# Patient Record
Sex: Male | Born: 1980 | Race: White | Hispanic: No | Marital: Single
Health system: Midwestern US, Academic
[De-identification: ages and names within clinical notes are randomized; demographics above are authoritative.]

---

## 2017-05-15 MED ORDER — INSULIN 100 UNITS IN 100 ML NS INJECTION
5 [IU] | Freq: Once | INTRAVENOUS | 0 refills | Status: DC
Start: 2017-05-15 — End: 2017-05-15

## 2017-05-15 MED ORDER — DOXYCYCLINE HYCLATE 100 MG PO TAB
100 mg | ORAL_TABLET | Freq: Two times a day (BID) | ORAL | 0 refills | 8.00000 days | Status: DC
Start: 2017-05-15 — End: 2017-08-02

## 2017-05-15 MED ORDER — LACTATED RINGERS IV SOLP
1000 mL | INTRAVENOUS | 0 refills | Status: CP
Start: 2017-05-15 — End: ?

## 2017-05-15 MED ORDER — ACETAMINOPHEN 500 MG PO TAB
1000 mg | Freq: Once | ORAL | 0 refills | Status: CP
Start: 2017-05-15 — End: ?

## 2017-05-15 MED ORDER — INSULIN ASPART 100 UNIT/ML SC FLEXPEN
5 [IU] | Freq: Once | SUBCUTANEOUS | 0 refills | Status: CP
Start: 2017-05-15 — End: ?

## 2017-05-15 MED ORDER — DOXYCYCLINE MONOHYDRATE 100 MG PO CAP
100 mg | ORAL_CAPSULE | Freq: Two times a day (BID) | ORAL | 0 refills | 8.00000 days | Status: DC
Start: 2017-05-15 — End: 2017-08-02

## 2017-05-15 MED ORDER — LACTATED RINGERS IV SOLP
1000 mL | INTRAVENOUS | 0 refills | Status: DC
Start: 2017-05-15 — End: 2017-05-15

## 2017-05-22 ENCOUNTER — Encounter: Admit: 2017-05-22 | Discharge: 2017-05-22 | Payer: MEDICAID

## 2017-05-22 ENCOUNTER — Emergency Department: Admit: 2017-05-22 | Discharge: 2017-05-23 | Disposition: A | Discharge status: Home / Self Care

## 2017-05-22 LAB — URINALYSIS, MICROSCOPIC

## 2017-05-22 LAB — POC GLUCOSE
Lab: 482 mg/dL — ABNORMAL HIGH (ref 70–100)
Lab: 502 mg/dL — ABNORMAL HIGH (ref 70–100)
Lab: 252 mg/dL — ABNORMAL HIGH (ref 70–100)

## 2017-05-22 LAB — POC LACTATE: Lab: 1.3 MMOL/L (ref 0.5–2.0)

## 2017-05-22 LAB — CBC AND DIFF
Lab: 0 10*3/uL (ref 0–0.20)
Lab: 0.1 10*3/uL (ref 0–0.45)
Lab: 7.3 10*3/uL (ref 4.5–11.0)

## 2017-05-22 LAB — URINALYSIS DIPSTICK
Lab: 7 % — ABNORMAL HIGH (ref 5.0–8.0)
Lab: NEGATIVE K/UL (ref 3–12)
Lab: NEGATIVE U/L (ref 7–40)
Lab: NEGATIVE U/L — ABNORMAL HIGH (ref 25–110)
Lab: NEGATIVE mL/min (ref 1.0–4.8)

## 2017-05-22 LAB — BLOOD GASES, PERIPHERAL VENOUS: Lab: 7.4 MMOL/L — ABNORMAL HIGH (ref 7.30–7.40)

## 2017-05-22 LAB — BETA HYDROXYBUTYRATE (KETONES): Lab: 0.2 MMOL/L — ABNORMAL LOW (ref <0.3)

## 2017-05-22 LAB — COMPREHENSIVE METABOLIC PANEL
Lab: 133 MMOL/L — ABNORMAL LOW (ref 137–147)
Lab: >60 mL/min (ref >60)

## 2017-05-22 MED ORDER — INSULIN REGULAR HUMAN(#) 1 UNIT/ML IJ SYRINGE
10 [IU] | Freq: Once | INTRAVENOUS | 0 refills | Status: CP
Start: 2017-05-22 — End: ?
  Administered 2017-05-22: 19:00:00 10 [IU] via INTRAVENOUS

## 2017-05-22 MED ORDER — DOXYCYCLINE HYCLATE 100 MG PO TAB
100 mg | ORAL_TABLET | Freq: Two times a day (BID) | ORAL | 0 refills | 8.00000 days | Status: AC
Start: 2017-05-22 — End: 2017-08-02

## 2017-05-22 MED ORDER — FENTANYL CITRATE (PF) 50 MCG/ML IJ SOLN
50 ug | Freq: Once | INTRAVENOUS | 0 refills | Status: CP
Start: 2017-05-22 — End: ?
  Administered 2017-05-22: 19:00:00 50 ug via INTRAVENOUS

## 2017-05-22 MED ORDER — ACETAMINOPHEN 325 MG PO TAB
650 mg | Freq: Once | ORAL | 0 refills | Status: CP
Start: 2017-05-22 — End: ?
  Administered 2017-05-23: 01:00:00 650 mg via ORAL

## 2017-05-22 MED ORDER — LACTATED RINGERS IV SOLP
1000 mL | INTRAVENOUS | 0 refills | Status: CP
Start: 2017-05-22 — End: ?
  Administered 2017-05-22: 17:00:00 1000 mL via INTRAVENOUS

## 2017-05-22 NOTE — Other
Critical result or procedure called (document test and value, and read back):  POC glucose 502  Time MD/NP Notified:  1154  MD/NP Name:  Dr. Hyacinth MeekerMiller  MD/NP Response/Orders Given:  Acknowledged

## 2017-05-22 NOTE — Other
Critical result or procedure called (document test and value, and read back):  Blood glucose 525, read back complete   Time MD/NP Notified:  1256  MD/NP Name:  Dr. Manson PasseyBrown  MD/NP Response/Orders Given:  Notified, orders in place

## 2017-05-22 NOTE — Other
Critical result or procedure called (document test and value, and read back):  Blood glucose 525  Time MD/NP Notified: 1301  MD/NP Name:  Dr. Hyacinth MeekerMiller  MD/NP Response/Orders Given:  Acknowledged

## 2017-05-22 NOTE — ED Notes
Pt requesting pain medication for 10/10 abdominal pain. Will notify MD.

## 2017-05-22 NOTE — ED Notes
[name] arrived to ED with CC of abdominal pain and scrotum pain. Pt states he had an abscess on his abdomen drained in April and it began bleeding/draining a few days ago. Pt also states he is a diabetic and hasn't had his insulin since last Saturday due to traveling and not having enough of his medication. Pt reports N/V and chills over the past few days. Pt currently afebrile. Pt abdomen tender on palpation. Pt denies trauma to scrotal area and states he noticed it began hurting on Tuesday. Pt denies taking anything at home to help with the pain because he doesn't have any medication with him. Pt aaox4; breathing non-labored; skin warm/dry/appropriate for ethnicity.     Pt belongings:  DealerBlack fingerless gloves  Black wheelchair  Baseball cap  Red t shirt  Cell phone  shorts

## 2017-05-23 ENCOUNTER — Emergency Department: Admit: 2017-05-22 | Discharge: 2017-05-22 | Payer: MEDICAID

## 2017-05-23 DIAGNOSIS — R739 Hyperglycemia, unspecified: ICD-10-CM

## 2017-05-23 DIAGNOSIS — L0291 Cutaneous abscess, unspecified: Principal | ICD-10-CM

## 2017-05-23 NOTE — Case Management (ED)
Case Management Progress Note    NAME:Bryan Bell                          MRN: [id number]              DOB:10/30/1981          AGE: 36 y.o.  ADMISSION DATE: 05/22/2017             DAYS ADMITTED: LOS: 0 days      Today???s Date: 05/22/2017    Plan: Pt provided with a bus pass.     Interventions  ? Support      ? Info or Referral      ? Discharge Planning       ??? SW was notified by RN that pt was in need of a medication voucher as he was unable to afford the prescription provided to him by [facility] ED last weekend.   ??? Pt's previous visit listed under MRN [id number].  ??? SW met with pt last weekend and he is not eligible for a medication voucher as he has been provided one within the last year.   ??? Per medication voucher policy, only one voucher can be provided per year and pt received multiple vouchers in 2017.   ??? SW notified pt last weekend that he could set up an account with [facility] and receive his medications, which he did not do.   ??? SW met with pt and notified him again that SW unable to provide medication voucher related to policy and that he can set up an account with the pharmacy to obtain his antibiotic today.   ??? Pt stated he would not be able to pay the 10% due for the prescription next month.   ??? SW provided pt with [facility] [place] information as they may be able to help.   ??? Pt and SW discussed discharge disposition.   ??? Pt's friend in [place] cannot come get him as her car is not working.   ??? SW is not able to cab pt to [place] as cabs do not go that far.   ??? Pt does not have anyone local that he wants to stay with and said that he can't stay with family.  ??? Pt said he would rather sleep in a ditch than go to the shelter.   ??? Pt asked for a change of clothing, which was provided.   ??? SW encouraged pt to notify SW if he thinks of someone to stay with locally as SW can assist with transportation.   ??? SW was notified by RN that pt was provided a bus pass at discharge.     ? Medication Needs ? Financial      ? Legal      ? Other        Disposition  ? Expected Discharge Date       ? Transportation      ? Next Level of Care (Acute Psych discharges only)      ? Discharge Disposition    Durable Medical Equipment     No service has been selected for the patient.      [place] [facility]     No service has been selected for the patient.      [facility]     No service has been selected for the patient.       Dialysis/Infusion     No service has been  selected for the patient.          Wilfrid Lund, LMSW  Social Work Sports coach  Emergency Department  Pager [phone]

## 2017-05-27 ENCOUNTER — Encounter: Admit: 2017-05-27 | Discharge: 2017-05-27 | Payer: MEDICAID

## 2017-05-27 DIAGNOSIS — F99 Mental disorder, not otherwise specified: ICD-10-CM

## 2017-05-27 DIAGNOSIS — J449 Chronic obstructive pulmonary disease, unspecified: ICD-10-CM

## 2017-05-27 DIAGNOSIS — E785 Hyperlipidemia, unspecified: ICD-10-CM

## 2017-05-27 DIAGNOSIS — I10 Essential (primary) hypertension: ICD-10-CM

## 2017-05-27 DIAGNOSIS — E119 Type 2 diabetes mellitus without complications: Principal | ICD-10-CM

## 2017-08-01 ENCOUNTER — Inpatient Hospital Stay: Admit: 2017-08-01 | Discharge: 2017-08-06 | Disposition: A | Discharge status: Home / Self Care

## 2017-08-01 ENCOUNTER — Encounter: Admit: 2017-08-01 | Discharge: 2017-08-01 | Payer: MEDICAID

## 2017-08-01 DIAGNOSIS — E785 Hyperlipidemia, unspecified: ICD-10-CM

## 2017-08-01 DIAGNOSIS — S21209A Unspecified open wound of unspecified back wall of thorax without penetration into thoracic cavity, initial encounter: ICD-10-CM

## 2017-08-01 DIAGNOSIS — I10 Essential (primary) hypertension: ICD-10-CM

## 2017-08-01 DIAGNOSIS — F99 Mental disorder, not otherwise specified: ICD-10-CM

## 2017-08-01 DIAGNOSIS — E119 Type 2 diabetes mellitus without complications: Principal | ICD-10-CM

## 2017-08-01 DIAGNOSIS — J449 Chronic obstructive pulmonary disease, unspecified: ICD-10-CM

## 2017-08-01 LAB — COMPREHENSIVE METABOLIC PANEL
Lab: 0.3 mg/dL (ref 0.3–1.2)
Lab: 0.6 mg/dL (ref 0.4–1.24)
Lab: 102 MMOL/L — ABNORMAL LOW (ref 98–110)
Lab: 106 U/L (ref 25–110)
Lab: 132 MMOL/L — ABNORMAL LOW (ref 137–147)
Lab: 14 U/L (ref 7–40)
Lab: 17 U/L (ref 7–56)
Lab: 23 MMOL/L (ref 21–30)
Lab: 3.6 g/dL (ref 3.5–5.0)
Lab: 4.1 MMOL/L — ABNORMAL LOW (ref 3.5–5.1)
Lab: 469 mg/dL — ABNORMAL HIGH (ref 70–100)
Lab: 7 10*3/uL (ref 3–12)
Lab: 7.4 g/dL (ref 6.0–8.0)
Lab: 9 mg/dL (ref 7–25)
Lab: 9.1 mg/dL (ref 8.5–10.6)
Lab: >60 mL/min (ref >60)
Lab: >60 mL/min (ref >60)

## 2017-08-01 LAB — CBC AND DIFF
Lab: 0.1 10*3/uL (ref 0–0.20)
Lab: 8.7 K/UL (ref 4.5–11.0)

## 2017-08-01 MED ORDER — SULFAMETHOXAZOLE-TRIMETHOPRIM 800-160 MG PO TAB
1 | Freq: Two times a day (BID) | ORAL | 0 refills | Status: DC
Start: 2017-08-01 — End: 2017-08-06
  Administered 2017-08-02 – 2017-08-06 (×8): 1 via ORAL

## 2017-08-01 MED ORDER — INSULIN GLARGINE 100 UNIT/ML (3 ML) SC INJ PEN
90 [IU] | Freq: Two times a day (BID) | SUBCUTANEOUS | 0 refills | Status: DC
Start: 2017-08-01 — End: 2017-08-02
  Administered 2017-08-02: 04:00:00 90 [IU] via SUBCUTANEOUS

## 2017-08-01 MED ORDER — INSULIN ASPART 100 UNIT/ML SC FLEXPEN
30 [IU] | Freq: Three times a day (TID) | SUBCUTANEOUS | 0 refills | Status: DC
Start: 2017-08-01 — End: 2017-08-03

## 2017-08-01 MED ORDER — INSULIN ASPART 100 UNIT/ML SC FLEXPEN
8 [IU] | Freq: Once | SUBCUTANEOUS | 0 refills | Status: CP
Start: 2017-08-01 — End: ?
  Administered 2017-08-02: 04:00:00 8 [IU] via SUBCUTANEOUS

## 2017-08-01 MED ORDER — ACETAMINOPHEN 500 MG PO TAB
1000 mg | ORAL | 0 refills | Status: DC | PRN
Start: 2017-08-01 — End: 2017-08-06
  Administered 2017-08-02 – 2017-08-06 (×7): 1000 mg via ORAL

## 2017-08-01 MED ORDER — ENOXAPARIN 40 MG/0.4 ML SC SYRG
40 mg | Freq: Every day | SUBCUTANEOUS | 0 refills | Status: DC
Start: 2017-08-01 — End: 2017-08-06
  Administered 2017-08-02 – 2017-08-06 (×4): 40 mg via SUBCUTANEOUS

## 2017-08-01 MED ORDER — INSULIN ASPART 100 UNIT/ML SC FLEXPEN
0-14 [IU] | Freq: Before meals | SUBCUTANEOUS | 0 refills | Status: DC
Start: 2017-08-01 — End: 2017-08-03
  Administered 2017-08-02: 04:00:00 10 [IU] via SUBCUTANEOUS

## 2017-08-01 MED ORDER — SODIUM CHLORIDE 0.9 % IV SOLP
1000 mL | INTRAVENOUS | 0 refills | Status: CP
Start: 2017-08-01 — End: ?
  Administered 2017-08-02: 05:00:00 1000 mL via INTRAVENOUS

## 2017-08-01 NOTE — ED Notes
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 Pt states that he is currently receiving abx for wound (s) infection, pt states today the pain has increased. Pt denies any other complaints at this time. Pt is AAO4, bed in lowest locked position, side rails up, call light in reach. Will continue to monitor pt.  All belongings gathered and placed in belonging bag with patient labels at bedside. The bag(s) contain(s) the following:    Clothing: shirt, pants, underwear, socks  Shoes: tennis shoes  Identification/Drivers license: ID in pt wallet  Cash: under $50  Credit cards: 2-3 cards  Electronics: cell phone  Dentures/Glasses/Hearing aids: glasses    All belongings placed on pt currently     Belongings disposition: with patient at bedside

## 2017-08-02 ENCOUNTER — Encounter: Admit: 2017-08-02 | Discharge: 2017-08-02 | Payer: MEDICAID

## 2017-08-02 DIAGNOSIS — F99 Mental disorder, not otherwise specified: ICD-10-CM

## 2017-08-02 DIAGNOSIS — E785 Hyperlipidemia, unspecified: ICD-10-CM

## 2017-08-02 DIAGNOSIS — I10 Essential (primary) hypertension: ICD-10-CM

## 2017-08-02 DIAGNOSIS — J449 Chronic obstructive pulmonary disease, unspecified: ICD-10-CM

## 2017-08-02 DIAGNOSIS — E119 Type 2 diabetes mellitus without complications: Principal | ICD-10-CM

## 2017-08-02 LAB — POC GLUCOSE
Lab: 353 mg/dL — ABNORMAL HIGH (ref 70–100)
Lab: 123 mg/dL — ABNORMAL HIGH (ref 70–100)
Lab: 65 mg/dL — ABNORMAL LOW (ref 70–100)
Lab: 88 mg/dL (ref 70–100)
Lab: 82 mg/dL (ref 70–100)
Lab: 110 mg/dL — ABNORMAL HIGH (ref 70–100)
Lab: 52 mg/dL — ABNORMAL LOW (ref 70–100)
Lab: 60 mg/dL — ABNORMAL LOW (ref 70–100)
Lab: 71 mg/dL (ref 70–100)
Lab: 59 mg/dL — ABNORMAL LOW (ref 70–100)
Lab: 167 mg/dL — ABNORMAL HIGH (ref 70–100)

## 2017-08-02 LAB — IRON + BINDING CAPACITY + %SAT+ FERRITIN
Lab: 104 ng/mL (ref 30–300)
Lab: 13 % — ABNORMAL LOW (ref 28–42)
Lab: 332 ug/dL — ABNORMAL HIGH (ref 270–380)
Lab: 43 ug/dL — ABNORMAL LOW (ref 50–185)

## 2017-08-02 LAB — C REACTIVE PROTEIN (CRP): Lab: 0.6 mg/dL (ref <1.0)

## 2017-08-02 LAB — URINALYSIS DIPSTICK
Lab: 7 (ref 5.0–8.0)
Lab: NEGATIVE
Lab: NEGATIVE
Lab: NEGATIVE
Lab: NEGATIVE
Lab: NEGATIVE
Lab: NEGATIVE

## 2017-08-02 LAB — COMPREHENSIVE METABOLIC PANEL: Lab: 139 MMOL/L — ABNORMAL HIGH (ref <150)

## 2017-08-02 LAB — URINALYSIS, MICROSCOPIC

## 2017-08-02 LAB — HEMOGLOBIN A1C: Lab: 12 % — ABNORMAL HIGH (ref 4.0–6.0)

## 2017-08-02 LAB — BETA HYDROXYBUTYRATE (KETONES): Lab: 0.1 MMOL/L (ref <0.3)

## 2017-08-02 LAB — CBC AND DIFF: Lab: 9.9 K/UL — ABNORMAL HIGH (ref 4.5–11.0)

## 2017-08-02 LAB — SED RATE: Lab: 50 mm/h — ABNORMAL HIGH (ref 0–15)

## 2017-08-02 LAB — MAGNESIUM: Lab: 2 mg/dL (ref 1.6–2.6)

## 2017-08-02 MED ORDER — SULFAMETHOXAZOLE-TRIMETHOPRIM 800-160 MG PO TAB
1 | ORAL_TABLET | Freq: Two times a day (BID) | ORAL | 0 refills | Status: AC
Start: 2017-08-02 — End: ?

## 2017-08-02 MED ORDER — VITS A AND D-WHITE PET-LANOLIN TP OINT
TOPICAL | 0 refills | Status: DC | PRN
Start: 2017-08-02 — End: 2017-08-06
  Administered 2017-08-03: 02:00:00 via TOPICAL

## 2017-08-02 MED ORDER — INSULIN LISPRO 100 UNIT/ML SC SOLN
30 [IU] | Freq: Three times a day (TID) | SUBCUTANEOUS | 0 refills | Status: CN
Start: 2017-08-02 — End: ?

## 2017-08-02 MED ORDER — KETOROLAC 10 MG PO TAB
10 mg | Freq: Once | ORAL | 0 refills | Status: CP
Start: 2017-08-02 — End: ?
  Administered 2017-08-02: 11:00:00 10 mg via ORAL

## 2017-08-02 MED ORDER — INSULIN LISPRO 100 UNIT/ML SC SOLN
42 [IU] | Freq: Three times a day (TID) | SUBCUTANEOUS | 1 refills | Status: SS
Start: 2017-08-02 — End: 2017-10-05

## 2017-08-02 MED ORDER — INSULIN GLARGINE 100 UNIT/ML (3 ML) SC INJ PEN
80 [IU] | Freq: Two times a day (BID) | SUBCUTANEOUS | 0 refills | Status: DC
Start: 2017-08-02 — End: 2017-08-03

## 2017-08-02 MED ORDER — VITAMIN A AND D TP OINT
1 refills | Status: SS
Start: 2017-08-02 — End: 2017-10-04

## 2017-08-02 MED ORDER — LISINOPRIL 5 MG PO TAB
5 mg | ORAL_TABLET | Freq: Every day | ORAL | 0 refills | Status: SS
Start: 2017-08-02 — End: 2017-11-03

## 2017-08-02 MED ORDER — INSULIN GLARGINE 100 UNIT/ML (3 ML) SC INJ PEN
70 [IU] | Freq: Two times a day (BID) | SUBCUTANEOUS | 0 refills | Status: DC
Start: 2017-08-02 — End: 2017-08-03

## 2017-08-02 MED ORDER — SULFAMETHOXAZOLE-TRIMETHOPRIM 800-160 MG PO TAB
1 | ORAL_TABLET | Freq: Two times a day (BID) | ORAL | 0 refills | Status: AC
Start: 2017-08-02 — End: 2017-08-02

## 2017-08-02 NOTE — ED Notes
Pt reports scrotal pain. Pt given tylenol

## 2017-08-02 NOTE — Consults
Unable to see patient today. Of note, patient has been seen by this consult service multiple times and has been provided various vouchers. Patient may benefit from a Frio bag that will help patient store insulin as patient is homeless. Will discuss with Dr. Zenaida NieceAmos tomorrow and will plan on meeting with patient.     Appreciate the consult. Please contact diabetes educators with any additional questions or concerns.     Perley JainAshlee Sula Fetterly, BSN, BS, RN, CPT  Diabetes Educator  Office: [phone]  Pager: [phone]  Diabetes Education Team Pager: [phone]  Diabetes Team Office: [phone]

## 2017-08-02 NOTE — Care Coordination-Inpatient
Moved to medprivate C team, till 8 am page [phone] after 8 am page med private C pager

## 2017-08-02 NOTE — Discharge Instructions
Discharge Documentation:    Activity as Tolerated   It is important to keep increasing your activity level after you leave the hospital.  Moving around can help prevent blood clots, lung infection (pneumonia) and other problems.  Gradually increasing the number of times you are up moving around will help you return to your normal activity level more quickly.  Continue to increase the number of times you are up to the chair and walking daily to return to your normal activity level. Begin to work toward your normal activity level at discharge     Report These Signs and Symptoms   Please contact your doctor if you have any of the following symptoms: temperature higher than 100 degrees F, uncontrolled pain or drainage with a foul odor     Questions About Your Stay   For questions or concerns regarding your hospital stay Call [phone]. If you have an emergency, do not call this number,and please dial 911. For non-urgent matters, please call during normal business hours- this will help direct your call to a physician more familiar with your care and needs    Please note:  * your hospital physicians will not be managing your ongoing outpatient care.  Please direct all calls regarding ongoing outpatient care to your primary care physician whenever possible.    * Do not call this number to request pain medications.  No pain medications will be filled nor refilled by calling this number.  * All refill requests should be directed to your primary care provider.   Discharging attending physician: Georgiana Shore [[id number]]      Diabetic Diet   You should eat between 2100 and 2400 calories per day.  This is equal to 75g (grams) of carbohydrates per meal, and 30g of carbohydrates for a bedtime snack.    If you have questions about your diet after you go home, you can call a dietitian at [phone].     Wound Care   Clean wounds with normal saline and gauze,  Apply Vitamin A and D ointment to wound base, Loosely tuck dry gauze into inferior wound and cover both wounds with ABD pad,  Secure in place with Hypafix tape,  Change dressing daily.       Current Discharge Medication List       START taking these medications    Details   vitamin A and D oint Apply to back wounds and cover as instructed  Qty: 113 g, Refills: 1    PRESCRIPTION TYPE:  Normal          CONTINUE these medications which have been CHANGED or REFILLED    Details   insulin lispro(+) (HUMALOG U-100 INSULIN) 100 unit/mL injection Inject forty two Units under the skin three times daily before meals.  Qty: 10 vial, Refills: 1    PRESCRIPTION TYPE:  Normal      lisinopril (PRINIVIL; ZESTRIL) 5 mg tablet Take one tablet by mouth daily.  Qty: 90 tablet, Refills: 0    PRESCRIPTION TYPE:  Normal      trimethoprim/sulfamethoxazole (BACTRIM DS) 160/800 mg tablet Take one tablet by mouth twice daily for 10 days.  Qty: 20 tablet, Refills: 0    PRESCRIPTION TYPE:  Normal          CONTINUE these medications which have NOT CHANGED    Details   blood sugar diagnostic test strip Use 1 Strip as directed before meals and at bedtime.  Qty: 100 Strip, Refills: 1    PRESCRIPTION  TYPE:  Normal      Blood-Glucose Meter kit Use as directed  Qty: 1 Kit, Refills: 0    PRESCRIPTION TYPE:  Normal      insulin glargine (LANTUS) 100 unit/mL injection Inject 90 Units under the skin twice daily.    PRESCRIPTION TYPE:  Historical Med      Insulin Syringe-Needle U-100 (BD INSULIN SYRINGE ULTRA-FINE) 1 mL 31 gauge x 5/16 syrg Use to administer regular insulin  Qty: 100 Each, Refills: 1    PRESCRIPTION TYPE:  Normal      lancets MISC Use 1 Each as directed before meals and at bedtime.  Qty: 100 Each, Refills: 1    PRESCRIPTION TYPE:  Normal          The following medications were removed from your list. This list includes medications discontinued this stay and those removed from your prior med list in our system        doxycycline (MONODOX) 100 mg capsule doxycycline (VIBRAMYCIN) 100 mg tablet        doxycycline (VIBRAMYCIN) 100 mg tablet            Immunization History:    There is no immunization history on file for this patient.  Personal Belongings:     Case Management Agency Information:     Other Instructions:

## 2017-08-02 NOTE — ED Notes
Assumed care of this pt; received report from Corbin CityJenn, Charity fundraiserN. A/Ox4, airway patent, NAD, skin appropriate for race. Pt sleeping. Call light within reach.

## 2017-08-02 NOTE — Progress Notes
2315 - Assumed care of patient.  Patient is awake, alert, and oriented. New PIV started at the left forearm, and IVF infusing.      0100 - Patient called RN into room and stated, "I need some help with something off the books."  Patient then stated that he needed help getting a rubber band off of his penis.  The rubber band was noted, double twisted and pushed down to the shaft of his pelvis.  Penis erect, but normal color.  Patient stated it has been on there "for a long time", but if this were the case, his penis should have been purple for as tight as it was.  The band was cut off.  No further injury noted.  Patient offered a gown, put he declined stating he was hot.  Right side rail up, left one down so he could easily use urinals.  Patient requested something stronger than Tylenol to treat his pain.  Resident paged, order for Toradol received, but patient declined the offer.  I explained our limitation with ordering opioids with no clear source of pain.  Patient not happy, but accepting of the explanation.     0200 - Patient requests something to eat; a Healthy Choice meal was prepared.  Patient asleep by the time it was finished warming up.  Food left at bedside.    0300 - Patient noted laying on his stomach in the bed, snoring.  Right side rail of bed still up, left side rail down.      0530 - Patient found sleeping on the floor on right side of bed.  Patient had his pillow folded in half under his head and wrapped himself in blanket.  Moderate sternal rub required to wake him.  Patient stated he did not know how he got onto the floor, but I am certain he did not fall in any manner as he is across from the desk, the room to his door was open, and we would have heard a thump of some magnitude.  Resident notified.  [facility] tech staff today.

## 2017-08-02 NOTE — Progress Notes
RESPIRATORY THERAPY  ADULT PROTOCOL EVALUATION      RESPIRATORY PROTOCOL PLAN    Medications       Note: If indicated by protocol, medication orders will be placed by therapist.    Procedures          _____________________________________________________________    PATIENT EVALUATION RESULTS    Chart Review  * Pulmonary Hx: Hx pulmonary disease, hx reactive or obstructive airway disease (PEFR & AM) OR regular home use of bronchodilators (AM) OR inhaled or systemic steroid use for lungs < or equal to 4 times/yr (AM) (pt does not take medication )    * Surgical Hx: No surgery OR last surgery > 6 weeks ago OR trach/stoma (BA)    * Chest X-Ray: Clear OR not available    * PFT/Oxygenation: FEV1, PEFR > 80% predicted OR physically unable to perform OR Pa02 >80 RA OR Sp02 >95% RA      Patient Assessment  * Respiratory Pattern: Regular pattern and rate OR good chest excursion with deep breathing    * Breath Sounds: Clear and able to auscultate bases posteriorly    * Cough / Sputum: Strong, effective cough OR nonproductive    * Mental Status: Alert, oriented, cooperative    * Activity Level: Ambulatory with assistance      Priority Index  Total Points: 4 Points  * Priority Index: Criteria not met    PRIORITY INDEX GUIDELINES*  Priority Points   1 0-9 points   2 9-18 points   3 > 18 points   + Pulm Dx or Home Rx   *Higher points indicate higher acuity.      Therapist: Ernestene Kiel, RT  Date: 08/02/2017      Key  AC = Airway clearance  AM = Aerosolized medication  BA = Bland aerosol  DB&C = Deep breathe & cough  FEV1 = Forced expiratory volume in first second)  IC = Inspiratory capacity  LE = Lung expansion  MDI = Metered dose inhaler  Neb = Nebulizer  O2 = Oxygen  Oxim =Oximetry  PEFR = Peak expiratory flow rate  RRT = Rapid Response Team

## 2017-08-02 NOTE — Case Management (ED)
Case Management Progress Note    NAME:Bryan Bell                          MRN: [id number]              DOB:1981-05-30          AGE: 36 y.o.  ADMISSION DATE: 08/01/2017             DAYS ADMITTED: LOS: 1 day      Todays Date: 08/02/2017    Plan: Home today   Pt is uninsured and needs dressings for the wounds on his back.    Interventions  ? Support      ? Info or Referral      ? Discharge Planning   NCM ordered gloves, 4x4s, saline flushes, ABDs and hypafix tape from [employer] to be delivered to CTU. NCM spoke with pt's RN requesting she watch for the supplies and deliver them to the pt when they arrive. Dr Zenaida NieceAmos ordered the A&D ointment recommended by Wound Team.    ? Medication Needs      ? Financial      ? Legal      ? Other      Disposition  ? Expected Discharge Date       ? Transportation      ? Next Level of Care (Acute Psych discharges only)      ? Discharge Disposition         Durable Medical Equipment     No service has been selected for the patient.      [place] [facility]     No service has been selected for the patient.      [facility]     No service has been selected for the patient.      [facility]     No service has been selected for the patient.      Bryan Damearol Ovid Witman, RN, ACM  Integrated Nurse Case Manager  [phone]  Pgr [phone]

## 2017-08-02 NOTE — Consults
Wound Ostomy Note    NAME:Bryan Bell                                                                   MRN: [id number]                 DOB:11/01/1981          AGE: 36 y.o.  ADMISSION DATE: 08/01/2017             DAYS ADMITTED: LOS: 1 day      Reason for Consult/Visit: wound not pressure    Assessment/Plan:    Active Problems:    Hyperglycemia    Wound, open, back    Uncontrolled diabetes mellitus (HCC)        Wounds (NOT for Pressure Injuries) 08/01/17 2200 Left Back Surgical Incision Superior wound  (Active)   08/01/17 2200 Back   Wound Orientation: Left   Wound Type: Surgical Incision   Wound Type:: Superior wound    Wound Description (Comments):    Wound Base Assessment Moist;Pink 08/02/2017  1:00 PM   Surrounding Skin Assessment Erythema;Dry;Intact 08/02/2017  1:00 PM   Wound Site Closure Open to Air 08/02/2017  1:00 PM   Wound Drainage Amount Scant 08/02/2017  1:00 PM   Wound Drainage Description Serosanguineous 08/02/2017  1:00 PM   Wound Dressing Status Open to Air 08/02/2017  1:00 PM   Wound Length (cm) 0.5 cm 08/02/2017  1:00 PM   Wound Width (cm) 1 cm 08/02/2017  1:00 PM   Wound Depth (cm) 1 cm 08/02/2017  1:00 PM   Wound Volume (cm^3) 0.5 cm^3 08/02/2017  1:00 PM   Number of days: 1       Wounds (NOT for Pressure Injuries) 08/02/17 Left;Upper Back Inferior wound  (Active)   08/02/17  Back   Wound Orientation: Left;Upper   Wound Type:    Wound Type:: Inferior wound    Wound Description (Comments):    Wound Base Assessment Moist;Pink;Yellow;Slough 08/02/2017  1:00 PM   Surrounding Skin Assessment Erythema;Dry;Intact 08/02/2017  1:00 PM   Wound Site Closure Open to Air 08/02/2017  1:00 PM   Wound Drainage Amount Scant 08/02/2017  1:00 PM   Wound Drainage Description Serosanguineous;Yellow 08/02/2017  1:00 PM   Wound Dressing Status None 08/02/2017  1:00 PM   Wound Length (cm) 2 cm 08/02/2017  1:00 PM   Wound Width (cm) 5 cm 08/02/2017  1:00 PM   Wound Depth (cm) 1.6 cm 08/02/2017  1:00 PM Wound Volume (cm^3) 16 cm^3 08/02/2017  1:00 PM   Number of days: 0     36 y.o. male with PMH significant for, COPD, HTN, uncontrolled DM type I with history of left thoracic back abscess s/p I&D presents to the ED with complaints of worsening pain around wound.    Per patient left upper back abscesses were opened at an OSH. Patient reports he is homeless and has not been keeping wounds covered. Reports a moderate amount of wound exudate.   Superior wound appears to be healing, wound base is minimally moist and pink.   Inferior wound is moist, covered 80% in yellow slough with underlining pink moist tissue.   Patient voices concern of keeping wounds clean and covered by himself, states  he has no one to assist with wound care.     RECOMMEND:   Clean wounds with normal saline and gauze,  Apply Vitamin A and D ointment to wound base,  Loosely tuck dry gauze into inferior wound and cover both wounds with ABD pad,  Secure in place with Hypafix tape,  Change dressing daily.     Primary Team is responsible for placing wound care orders.     Will continue to follow.    Sanda Klein, RN, BSN, [facility]  Wound/Ostomy Team   Pager: [phone]  After Hours Wound/OstomyTeam Pager: [phone]

## 2017-08-02 NOTE — Case Management (ED)
CMA Note:    [facility] MondayEvan Mitchell to deliver clothing and 2 bus passes to pt. Clothing and bus passes delivered to pt's room.  Floor nurse notified.     Merryl Hackeriera Kyrianna Barletta  Case Management Assistant  For additional assistance please contact SWCM Alvira MondayEvan Mitchell  [phone]

## 2017-08-02 NOTE — ED Notes
Pt report given to Denny PeonErin, RN on CTU. No further questions at this time. Pt transport will be requested to [id number] when bed status is appropriate for pt arrival.

## 2017-08-02 NOTE — ED Notes
Pt's BG is 52. Pt given juice, crackers, and jello

## 2017-08-02 NOTE — Progress Notes
Pt remains stable. Complains of back wound pain. Requesting pain medication other than acetaminophen and tramadol. Notified MD, no further orders at this time. Provider stated that he will see patient when he is next available.     Pt given soup, crackers, and peanut butter. Meds admin per emar. Belongings at bedside with patient. Call light within reach. Urinal within reach.

## 2017-08-02 NOTE — Discharge Instructions - Pharmacy
Physician Discharge Summary      Name: Bryan Bell  Medical Record Number: [id number]        Account Number:  [id number]  Date Of Birth:  Sep 24, 1981                         Age:  36 years   Admit date:  08/01/2017                     Discharge date:  08/06/2017    Attending Physician:  Dr. Georgiana Shore               Service: Med Private C- [phone]    Physician Summary completed by: Cameron Proud    Reason for hospitalization: Open wound of upper back    Significant PMH:   Past Medical History:   Diagnosis Date   ??? COPD (chronic obstructive pulmonary disease) (HCC)    ??? DM (diabetes mellitus) (HCC)    ??? Hyperlipidemia    ??? Hypertension    ??? Psychiatric illness     bipolar, depression, anxiety,           Allergies: Patient has no known allergies.    Admission Physical Exam notable for:    Skin: left thoracic back wound with granulation tissue no significant surrounding erythema or fluctuance  Abdomen:  BS+, soft, no guarding or rigidity.  Nontender to palpation without palpable masses.  No rebound tenderness.     Admission Lab/Radiology studies notable for:  Blood sugar 469, Beta hydroxybutyrate 0.1   ESR 50, CRP 0.6, WBC 8.7  Hemoglobin 11.6, MCV 82.5    Brief Hospital Course:  The patient was admitted and the following issues were addressed during this hospitalization:     Open wound of left upper back - Patient presented with back pain with open wound of his left thoracic back with history of recent I&D in [place]. Tylenol, heating pad, and lidocaine patch were given for pain. Bactrim was continued and wound team was consulted. Wound team recommending vitamin A & D ointment, dry guaze in wound covered with ABD pad and secured. Clean wound daily with normal saline and guaze. Patient was discharged with bactrim prescription, vitamin A and D ointment, and guaze.     Hyperglycemia/uncontrolled Type 2 diabetes - Patient presented with blood sugar of 469. Beta hydroxybutyrate 0.1. Hemoglobin A1C was 12.8%. Diabetes educator was consulted. Patient indicated he was a type 1 diabetic and was started on reported home insulin of 90 U lantus BID and humalog 30 U TID. Patient was given 90 U lantus 8/12 PM and 8/13 AM, and 70 U lantus 8/13 PM. Lantus was decreased to 45 units for hypoglycemia the morning of 8/14. He required a D10 drip started 8/14 and he was given 5 boluses of D50 08/04/17 for ongoing hypoglycemia. Endocrine was consulted 8/15. Endocrine evaluation revealed patient is an uncontrolled type 2 diabetic. He was continued on dextrose drip until 08/05/17. He was started on lantus 20 U, novalog 6 U TID and MDCF 08/05/17. His blood sugar remained above 200 with this regimen. Lantus was increased to 30 units and aspart 15 units TID on 8/17 and discharged home with this regimen. Patient was given Frio bag for insulin, vouchers for lantus and novolog and glucometer at discharge.    Chest pain - Patient report chest pain on 8/15 and had a negative EKG. Troponin was elevated at 0.23 8/15. Repeat troponin was 0.22 8/16.  Diarrhea - Patient reported diarrhea of 1 month duration. He reports he had his last bowel movement several days ago. He reports his symptoms comes and goes and depends on his diet. He has been on bactrim for one month. C. Difficile PCR was ordered.    Normocytic anemia - Patient was found to have a hemoglobin of 11.6. Iron panel was ordered showing:  Iron 43, %sat 13, TIBC 332, Ferritin 104.     Scrotal Pain - Patient complained of scrotal pain and an US scrotum was taken 08/04/17 and showed calcifications in both testicles from prior inflammation or infection.    COPD and tobacco abuse - patient smokes 0.5 ppd but denies any home COPD medications. Nicotine patch ordered 8/15 and tobacco health education material provided.    Elevated WBC- Patient was noted to have WBC of 12.7 (8/14) and 12.1 (8/15). Patient was asymptomatic. Lactate was 1.9 and procalcitonin was negative 08/04/17. Blood cultures were drawn 08/04/17 and had no growth at the time of discharge.    Hypertension- Patient was restarted on his reported home dose of lisinopril 5 mg on 08/04/17.    Hypokalemia - Patient had a potassium of 3.2 08/04/17 and 60 mEq potassium given. Potassium corrected to 4.3 8/16.    Fall- Patient was found on floor of shower at 0725 by RN on 8/12. Patient had refused fall bundle prior to fall. Patient was educated on importance of fall precautions. CT of head was ordered.     Condition at Discharge: Stable    Discharge Diagnoses:       Hospital Problems        Active Problems    Hyperglycemia    Wound, open, back    Uncontrolled diabetes mellitus (HCC)          Surgical Procedures: None    Significant Diagnostic Studies and Procedures:    US Exam chest 08/01/17  No sonographic evidence of abscess formation related to the pre-existing wound in the patient's left back/flank.     Ultrasound Scrotum Contents (08/04/17)  1. ???Several tiny calcifications in both testicles, likely sequelae of prior infection or inflammation. Otherwise, normal sonographic appearance of the testes.  2. 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    CT Head WO Contrast 08/06/17  1. ???No acute intracranial findings.  2. ???Nonspecific nodular skin thickening and subcutaneous edema of the suboccipital/postauricular soft tissues and to a lesser degree the parieto-occipital scalp, most likely infectious or sequelae of a prior infectious/inflammatory process. There are no definitive organized soft tissue fluid collections.    Consults:  Wound Team, Endocrine    Patient Disposition: Home       Patient instructions/medications:     Activity as Tolerated   It is important to keep increasing your activity level after you leave the hospital.  Moving around can help prevent blood clots, lung infection (pneumonia) and other problems.  Gradually increasing the number of times you are up moving around will help you return to your normal activity level more quickly.  Continue to increase the number of times you are up to the chair and walking daily to return to your normal activity level. Begin to work toward your normal activity level at discharge     Activity as Tolerated   It is important to keep increasing your activity level after you leave the hospital.  Moving around can help prevent blood clots, lung infection (pneumonia) and other problems.  Gradually increasing the number of times you are  up moving around will help you return to your normal activity level more quickly.  Continue to increase the number of times you are up to the chair and walking daily to return to your normal activity level. Begin to work toward your normal activity level at discharge     Report These Signs and Symptoms   Please contact your doctor if you have any of the following symptoms: temperature higher than 100 degrees F, uncontrolled pain or drainage with a foul odor     Questions About Your Stay   For questions or concerns regarding your hospital stay Call [phone]. If you have an emergency, do not call this number,and please dial 911. For non-urgent matters, please call during normal business hours- this will help direct your call to a physician more familiar with your care and needs    Please note:  * your hospital physicians will not be managing your ongoing outpatient care.  Please direct all calls regarding ongoing outpatient care to your primary care physician whenever possible.    * Do not call this number to request pain medications.  No pain medications will be filled nor refilled by calling this number.  * All refill requests should be directed to your primary care provider.   Discharging attending physician: Georgiana Shore [[id number]]      Diabetic Diet   You should eat between 2100 and 2400 calories per day.  This is equal to 75g (grams) of carbohydrates per meal, and 30g of carbohydrates for a bedtime snack.    If you have questions about your diet after you go home, you can call a dietitian at [phone].     Wound Care   Clean wounds with normal saline and gauze,  Apply Vitamin A and D ointment to wound base,  Loosely tuck dry gauze into inferior wound and cover both wounds with ABD pad,  Secure in place with Hypafix tape,  Change dressing daily.     Report These Signs and Symptoms   Please contact your doctor if you have any of the following symptoms: temperature higher than 100 degrees F, persistent nausea and/or vomiting, difficulty breathing or severe abdominal pain     Questions About Your Stay   For questions or concerns regarding your hospital stay. Call [phone]   Discharging attending physician: Georgiana Shore [[id number]]      Regular Diet   You have no dietary restriction. Please continue with a healthy balanced diet.        Current Discharge Medication List       START taking these medications    Details   vitamin A and D oint Apply to back wounds and cover as instructed  Qty: 113 g, Refills: 1    PRESCRIPTION TYPE:  Normal          CONTINUE these medications which have been CHANGED or REFILLED    Details   insulin lispro(+) (HUMALOG U-100 INSULIN) 100 unit/mL injection Inject forty two Units under the skin three times daily before meals.  Qty: 10 vial, Refills: 1    PRESCRIPTION TYPE:  Normal      lisinopril (PRINIVIL; ZESTRIL) 5 mg tablet Take one tablet by mouth daily.  Qty: 90 tablet, Refills: 0    PRESCRIPTION TYPE:  Normal      trimethoprim/sulfamethoxazole (BACTRIM DS) 160/800 mg tablet Take one tablet by mouth twice daily for 10 days.  Qty: 20 tablet, Refills: 0    PRESCRIPTION TYPE:  Normal  CONTINUE these medications which have NOT CHANGED    Details   blood sugar diagnostic test strip Use 1 Strip as directed before meals and at bedtime.  Qty: 100 Strip, Refills: 1    PRESCRIPTION TYPE:  Normal      Blood-Glucose Meter kit Use as directed  Qty: 1 Kit, Refills: 0 PRESCRIPTION TYPE:  Normal      insulin glargine (LANTUS) 100 unit/mL injection Inject 90 Units under the skin twice daily.    PRESCRIPTION TYPE:  Historical Med      Insulin Syringe-Needle U-100 (BD INSULIN SYRINGE ULTRA-FINE) 1 mL 31 gauge x 5/16 syrg Use to administer regular insulin  Qty: 100 Each, Refills: 1    PRESCRIPTION TYPE:  Normal      lancets MISC Use 1 Each as directed before meals and at bedtime.  Qty: 100 Each, Refills: 1    PRESCRIPTION TYPE:  Normal          The following medications were removed from your list. This list includes medications discontinued this stay and those removed from your prior med list in our system        doxycycline (MONODOX) 100 mg capsule        doxycycline (VIBRAMYCIN) 100 mg tablet        doxycycline (VIBRAMYCIN) 100 mg tablet                 Pending items needing follow up:   Blood cultures drawn 08/04/17    Signed:  Cameron Proud  08/02/2017      cc:  Primary Care Physician:  No Pcp, Na   Bjorn Loser, NP, [place]  Referring physicians:  No ref. provider found   Additional provider(s):

## 2017-08-03 LAB — POC GLUCOSE
Lab: 54 mg/dL — ABNORMAL LOW (ref 70–100)
Lab: 51 mg/dL — ABNORMAL LOW (ref 70–100)
Lab: 61 mg/dL — ABNORMAL LOW (ref 70–100)
Lab: 111 mg/dL — ABNORMAL HIGH (ref 70–100)
Lab: 133 mg/dL — ABNORMAL HIGH (ref 70–100)
Lab: 95 mg/dL (ref 70–100)
Lab: 107 mg/dL — ABNORMAL HIGH (ref 70–100)
Lab: 25 mg/dL — CL (ref 70–100)
Lab: 39 mg/dL — CL (ref 70–100)
Lab: 101 mg/dL — ABNORMAL HIGH (ref 70–100)
Lab: 108 mg/dL — ABNORMAL HIGH (ref 70–100)
Lab: 62 mg/dL — ABNORMAL LOW (ref 70–100)
Lab: 159 mg/dL — ABNORMAL HIGH (ref 70–100)
Lab: 70 mg/dL (ref 70–100)
Lab: 152 mg/dL — ABNORMAL HIGH (ref 70–100)
Lab: 55 mg/dL — ABNORMAL LOW (ref 70–100)
Lab: 79 mg/dL (ref 70–100)
Lab: 83 mg/dL (ref 70–100)

## 2017-08-03 LAB — CBC AND DIFF: Lab: 12 K/UL — ABNORMAL HIGH (ref >60)

## 2017-08-03 LAB — COMPREHENSIVE METABOLIC PANEL: Lab: 137 MMOL/L — ABNORMAL LOW (ref 137–147)

## 2017-08-03 MED ORDER — INSULIN ASPART 100 UNIT/ML SC FLEXPEN
0-14 [IU] | Freq: Before meals | SUBCUTANEOUS | 0 refills | Status: DC
Start: 2017-08-03 — End: 2017-08-04

## 2017-08-03 MED ORDER — DEXTROSE 50 % IN WATER (D50W) IV SOLP
50 mL | Freq: Once | INTRAVENOUS | 0 refills | Status: CP
Start: 2017-08-03 — End: ?
  Administered 2017-08-03: 22:00:00 50 mL via INTRAVENOUS

## 2017-08-03 MED ORDER — INSULIN GLARGINE 100 UNIT/ML (3 ML) SC INJ PEN
60 [IU] | Freq: Two times a day (BID) | SUBCUTANEOUS | 0 refills | Status: DC
Start: 2017-08-03 — End: 2017-08-03

## 2017-08-03 MED ORDER — DEXTROSE 5%-0.45% SODIUM CHLORIDE IV SOLP
1000 mL | Freq: Once | INTRAVENOUS | 0 refills | Status: CP
Start: 2017-08-03 — End: ?
  Administered 2017-08-03: 18:00:00 1000 mL via INTRAVENOUS

## 2017-08-03 MED ORDER — INSULIN GLARGINE 100 UNIT/ML (3 ML) SC INJ PEN
50 [IU] | Freq: Two times a day (BID) | SUBCUTANEOUS | 0 refills | Status: DC
Start: 2017-08-03 — End: 2017-08-03

## 2017-08-03 MED ORDER — DEXTROSE 10 % IN WATER (D10W) 10 % IV SOLP
INTRAVENOUS | 0 refills | Status: DC
Start: 2017-08-03 — End: 2017-08-04
  Administered 2017-08-03 – 2017-08-04 (×4): 250.000 mL via INTRAVENOUS

## 2017-08-03 MED ORDER — INSULIN ASPART 100 UNIT/ML SC FLEXPEN
15 [IU] | Freq: Three times a day (TID) | SUBCUTANEOUS | 0 refills | Status: DC
Start: 2017-08-03 — End: 2017-08-03

## 2017-08-03 MED ORDER — INSULIN ASPART 100 UNIT/ML SC FLEXPEN
0-7 [IU] | Freq: Before meals | SUBCUTANEOUS | 0 refills | Status: DC
Start: 2017-08-03 — End: 2017-08-03

## 2017-08-03 MED ORDER — INSULIN GLARGINE 100 UNIT/ML (3 ML) SC INJ PEN
45 [IU] | Freq: Two times a day (BID) | SUBCUTANEOUS | 0 refills | Status: DC
Start: 2017-08-03 — End: 2017-08-03

## 2017-08-03 MED ADMIN — DEXTROSE 50 % IN WATER (D50W) IV SOLP [86270]: INTRAVENOUS | @ 17:00:00 | Stop: 2017-08-03 | NDC 00409664802

## 2017-08-03 NOTE — Other
Critical result or procedure called (document test and value, and read back): BG 25  Time MD/NP Notified:  1133  MD/NP Name:  Zenaida Niecemos  MD/NP Response/Orders Given:  Will put in orders for D5

## 2017-08-03 NOTE — ED Notes
Pt's BG 35, pt awake and able to swallow, given 3 juices and meal arrived, pt eating now.

## 2017-08-03 NOTE — Progress Notes
General Progress Note    Name:  Bryan Bell   ZOXWR'U Date:  08/03/2017  Admission Date: 08/01/2017  LOS: 2 days                     Assessment/Plan:    Active Problems:    Hyperglycemia    Wound, open, back    Uncontrolled diabetes mellitus (HCC)    Hx of BKA, left (HCC)    Homelessness    36yoM w/ PMH of HTN, DM1 c/b L BKA, COPD presenting with diffuse back pain in setting of open wound of left upper back s/p I&D at [facility] and found to have hyperglycemia.    Changes made to today's assessment and plan are indicated in bold.  ???  Open wound of left upper back   - 0/4 SIRS   - ESR 50, CRP 0.6 (08/01/17)  - [place] 08/01/17 w/o abscess  - wound team consulted and recommending Vitamin A & D ointment, dry guaze in wound, cover with ABD pad, secure and change daily.  - continue bactrim for 10 days  - tylenol for pain, does not have an indication for opiates as he is requesting  ???  Hyperglycemia/Uncontrolled Type 1 diabetes  - HbA1C = 12.8   - profoundly hypoglycemia today with 45u of lantus  - will not give lantus tonight, hold meal time, and give MDCF  - diabetes educator consulted    Diarrhea - resolved  - now now BM x 3 days    COPD and tobacco abuse  - patient smokes 0.5 ppd  - patient denies any home COPD meds, no desire to stop smoking  ???  Normocytic anemia   - Previous Hb 15.0 (05/15/17), 13.2 (05/22/17)  - Hb 11.6 MCV 82.5 (08/02/17)  - Iron 43, %sat 13, TIBC 332, Ferritin 104 (08/02/17) - mildly iron deficient from poor diet  ???  HTN  - lisinopril 5mg  Qday    FEN: 1L of 1/2NS + D5, replace lytes PRN, ADA diet  Ppx: lovenox  Code: Full    Dispo: cont inpt admit, profoundly hypoglycemic today.  Clearly is not taking as much insulin as he reports and has been given Rx for in the past.    ________________________________________________________________________    Subjective  Bryan Bell is a 36 y.o. male.  Patient w/ ongoing pain in his back. Became diaphoretic and minimally responsive this morning when the diabetes RN was visiting with him.  Improved w/ amp of D50.      ROS: neg for fevers, chills, dyspnea, chest pain, palpitations     Medications  Scheduled Meds:    DEXTROSE 5% IN WATER IV SOLP (Cabinet Override)   NOW   enoxaparin (LOVENOX) syringe 40 mg 40 mg Subcutaneous QDAY(21)   insulin aspart U-100 (NOVOLOG FLEXPEN) injection PEN 0-14 Units 0-14 Units Subcutaneous ACHS   trimethoprim/sulfamethoxazole (BACTRIM DS) 160/800 mg tablet 1 tablet 1 tablet Oral BID   Continuous Infusions:  PRN and Respiratory Meds:acetaminophen Q8H PRN, vitamin A & D PRN      Objective                       Vital Signs: Last Filed                 Vital Signs: 24 Hour Range   BP: 144/68 (08/14 1108)  Temp: 36.4 ???C (97.5 ???F) (08/14 1108)  Respirations: 20 PER MINUTE (08/14 0749)  SpO2: 98 % (08/14  1108)  O2 Delivery: None (Room Air) (08/14 1108)  SpO2 Pulse: 84 (08/14 0324) BP: (133-157)/(68-80)   Temp:  [36.4 ???C (97.5 ???F)-36.9 ???C (98.4 ???F)]   Respirations:  [18 PER MINUTE-20 PER MINUTE]   SpO2:  [94 %-99 %]   O2 Delivery: None (Room Air)   Intensity Pain Scale 0-10 (Pain 1): Asleep (08/03/17 0200) Vitals:    08/01/17 1252 08/01/17 2155   Weight: 88.5 kg (195 lb) 98 kg (216 lb 0.8 oz)       Intake/Output Summary:  (Last 24 hours)    Intake/Output Summary (Last 24 hours) at 08/03/17 1504  Last data filed at 08/02/17 2147   Gross per 24 hour   Intake              500 ml   Output             2700 ml   Net            -2200 ml           Physical Exam  General:  Alert, cooperative, no distress, appears stated age.  Head:  Normocephalic, without obvious abnormality, atraumatic.  Eyes:  Conjunctivae/corneas clear.    Lungs:  CTAB, no rales, no wheezing.  Heart:    Regular rate and rhythm, no MRG.  Abdomen:  Soft, non-tender.  Bowel sounds normal.    Extremities:  No cyanosis, clubbing, nor edema.  S/p L BKA.  Pulses:   2+ radial pulses b/l Skin:   Multiple excoriations.  Two large wounds on his L scapula are covered, bandage is c/d/i    Lab Review  Hematology:    Lab Results   Component Value Date    HGB 11.0 08/03/2017    HCT 33.0 08/03/2017    PLTCT 372 08/03/2017    WBC 12.7 08/03/2017    NEUT 67 08/03/2017    ANC 8.50 08/03/2017    ALC 3.20 08/03/2017    MONA 5 08/03/2017    AMC 0.60 08/03/2017    ABC 0.10 08/03/2017    MCV 82.4 08/03/2017    MCHC 33.3 08/03/2017    MPV 7.0 08/03/2017    RDW 14.4 08/03/2017   , Coagulation:    Lab Results   Component Value Date    PTT 36.4 02/24/2016   , General Chemistry:    Lab Results   Component Value Date    NA 137 08/03/2017    K 3.8 08/03/2017    CL 108 08/03/2017    GAP 8 08/03/2017    BUN 11 08/03/2017    CR 0.69 08/03/2017    GLU 142 08/03/2017    CA 8.9 08/03/2017    ALBUMIN 3.2 08/03/2017    LACTIC 1.3 07/21/2016    MG 2.0 08/01/2017    TOTBILI 0.1 08/03/2017    and Enzymes:    Lab Results   Component Value Date    AST 12 08/03/2017    ALT 10 08/03/2017    ALKPHOS 83 08/03/2017       Point of Care Testing  (Last 24 hours)  Glucose: (!) 142 (08/03/17 0325)  POC Glucose (Download): (!) 159 (08/03/17 1334)    Radiology and other Diagnostics Review:    No radiology    Georgiana Shore, MD   Pager [phone]

## 2017-08-03 NOTE — ED Notes
Paged AOD, reporting blood glucose 53 mg/dl, orders received for 1 amp dextrose now and new orders for D10 iv fluids.

## 2017-08-03 NOTE — Consults
Diabetes Education:    Attempted to visit with pt and pt said he felt like his BG was continuing to drop.  Educator with assistance of bedside RN Shanda BumpsJessica, checked BG and result was 25.  Reported result to bedside RN and to Dr. Zenaida NieceAmos.  Bedside RN following hypoglycemia protocol.      Will attempt to visit with pt later today or tomorrow.    Jeannie FendBarbara Miyo Aina, BSN, RN  Clinical Nurse Coordinator - Diabetes Education  Nursing Clinical Excellence  [facility]  [email or web address]  [phone] office  [phone] pager

## 2017-08-03 NOTE — Progress Notes
Notified MD of pt's continued hypoglycemia. MD to decrease Lantus from 80 units to 70 units.

## 2017-08-03 NOTE — ED Notes
Pt BG 25 and becoming lethargic and diaphoretic. Pt given 50ml of 50% Dextrose. Pt alert and able to swallow. Pt given juice and crackers. Pt a/ox4. MD notified. Recheck BG 110

## 2017-08-03 NOTE — ED Notes
Pt BG 39. Pt given 3 juices. Meal arrived, pt eating now.

## 2017-08-04 ENCOUNTER — Inpatient Hospital Stay: Admit: 2017-08-04 | Discharge: 2017-08-04 | Payer: MEDICAID

## 2017-08-04 ENCOUNTER — Encounter: Admit: 2017-08-04 | Discharge: 2017-08-04 | Payer: MEDICAID

## 2017-08-04 DIAGNOSIS — E119 Type 2 diabetes mellitus without complications: Principal | ICD-10-CM

## 2017-08-04 DIAGNOSIS — J449 Chronic obstructive pulmonary disease, unspecified: ICD-10-CM

## 2017-08-04 DIAGNOSIS — E785 Hyperlipidemia, unspecified: ICD-10-CM

## 2017-08-04 DIAGNOSIS — I10 Essential (primary) hypertension: ICD-10-CM

## 2017-08-04 DIAGNOSIS — S21209A Unspecified open wound of unspecified back wall of thorax without penetration into thoracic cavity, initial encounter: ICD-10-CM

## 2017-08-04 DIAGNOSIS — F99 Mental disorder, not otherwise specified: ICD-10-CM

## 2017-08-04 LAB — POC GLUCOSE
Lab: 108 mg/dL — ABNORMAL HIGH (ref 70–100)
Lab: 122 mg/dL — ABNORMAL HIGH (ref 70–100)
Lab: 130 mg/dL — ABNORMAL HIGH (ref 70–100)
Lab: 131 mg/dL — ABNORMAL HIGH (ref 70–100)
Lab: 131 mg/dL — ABNORMAL HIGH (ref 70–100)
Lab: 157 mg/dL — ABNORMAL HIGH (ref 70–100)
Lab: 164 mg/dL — ABNORMAL HIGH (ref 70–100)
Lab: 183 mg/dL — ABNORMAL HIGH (ref 70–100)
Lab: 194 mg/dL — ABNORMAL HIGH (ref 70–100)
Lab: 218 mg/dL — ABNORMAL HIGH (ref 70–100)
Lab: 229 mg/dL — ABNORMAL HIGH (ref 70–100)
Lab: 255 mg/dL — ABNORMAL HIGH (ref 70–100)
Lab: 36 mg/dL — CL (ref 70–100)
Lab: 44 mg/dL — CL (ref 70–100)
Lab: 50 mg/dL — ABNORMAL LOW (ref 70–100)
Lab: 51 mg/dL — ABNORMAL LOW (ref 70–100)
Lab: 59 mg/dL — ABNORMAL LOW (ref 70–100)
Lab: 59 mg/dL — ABNORMAL LOW (ref 70–100)
Lab: 60 mg/dL — ABNORMAL LOW (ref 70–100)
Lab: 68 mg/dL — ABNORMAL LOW (ref 70–100)
Lab: 70 mg/dL (ref 70–100)
Lab: 72 mg/dL — ABNORMAL LOW (ref 70–100)
Lab: 77 mg/dL (ref 70–100)
Lab: 80 mg/dL (ref 70–100)
Lab: 80 mg/dL — ABNORMAL HIGH (ref 70–100)
Lab: 84 mg/dL (ref 70–100)
Lab: 89 mg/dL (ref 70–100)
Lab: 90 mg/dL (ref 70–100)
Lab: 95 mg/dL (ref 70–100)

## 2017-08-04 LAB — CBC AND DIFF
Lab: 12 K/UL — ABNORMAL HIGH (ref >60)
Lab: 4.4 M/UL — ABNORMAL LOW (ref >60)

## 2017-08-04 LAB — COMPREHENSIVE METABOLIC PANEL
Lab: 0.8 mg/dL (ref 0.4–1.24)
Lab: 12 U/L (ref 7–40)
Lab: 12 U/L (ref 7–56)
Lab: 137 MMOL/L (ref 137–147)
Lab: 3.3 MMOL/L — ABNORMAL LOW (ref 3.5–5.1)
Lab: 3.7 g/dL (ref 3.5–5.0)
Lab: 7.4 g/dL (ref 6.0–8.0)
Lab: 70 mg/dL (ref 70–100)
Lab: 94 U/L (ref 25–110)
Lab: >60 mL/min (ref >60)
Lab: >60 mL/min (ref >60)

## 2017-08-04 LAB — PHENCYCLIDINES-URINE RANDOM: Lab: NEGATIVE

## 2017-08-04 LAB — BENZODIAZEPINES-URINE RANDOM: Lab: NEGATIVE

## 2017-08-04 LAB — COCAINE-URINE RANDOM: Lab: NEGATIVE

## 2017-08-04 LAB — TROPONIN-I: Lab: 0.2 ng/mL — ABNORMAL HIGH (ref 0.0–0.05)

## 2017-08-04 LAB — OPIATES-URINE RANDOM: Lab: NEGATIVE

## 2017-08-04 LAB — CANNABINOIDS-URINE RANDOM: Lab: NEGATIVE

## 2017-08-04 LAB — PROCALCITONIN: Lab: 0 ng/mL — ABNORMAL HIGH (ref <0.10)

## 2017-08-04 LAB — BARBITURATES-URINE RANDOM: Lab: NEGATIVE

## 2017-08-04 LAB — LACTIC ACID(LACTATE): Lab: 1.9 MMOL/L — ABNORMAL HIGH (ref 0.5–2.0)

## 2017-08-04 LAB — AMPHETAMINES-URINE RANDOM: Lab: NEGATIVE

## 2017-08-04 MED ORDER — DEXTROSE 50 % IN WATER (D50W) IV SOLP
50 mL | Freq: Once | INTRAVENOUS | 0 refills | Status: CP
Start: 2017-08-04 — End: ?
  Administered 2017-08-04: 16:00:00 50 mL via INTRAVENOUS

## 2017-08-04 MED ORDER — DEXTROSE 50 % IN WATER (D50W) IV SOLP
50 mL | Freq: Once | INTRAVENOUS | 0 refills | Status: CP
Start: 2017-08-04 — End: ?

## 2017-08-04 MED ORDER — NICOTINE 7 MG/24 HR TD PT24
1 | Freq: Every day | TRANSDERMAL | 0 refills | Status: DC
Start: 2017-08-04 — End: 2017-08-06
  Administered 2017-08-04 – 2017-08-05 (×2): 1 via TRANSDERMAL

## 2017-08-04 MED ORDER — LISINOPRIL 5 MG PO TAB
5 mg | Freq: Every day | ORAL | 0 refills | Status: DC
Start: 2017-08-04 — End: 2017-08-06
  Administered 2017-08-04 – 2017-08-05 (×2): 5 mg via ORAL

## 2017-08-04 MED ORDER — POTASSIUM CHLORIDE 20 MEQ PO TBTQ
60 meq | Freq: Once | ORAL | 0 refills | Status: CP
Start: 2017-08-04 — End: ?
  Administered 2017-08-04: 14:00:00 60 meq via ORAL

## 2017-08-04 MED ORDER — DEXTROSE 50 % IN WATER (D50W) IV SOLP
50 mL | Freq: Once | INTRAVENOUS | 0 refills | Status: CP
Start: 2017-08-04 — End: ?
  Administered 2017-08-04: 14:00:00 50 mL via INTRAVENOUS

## 2017-08-04 MED ORDER — DEXTROSE 10 % IN WATER (D10W) 10 % IV SOLP
INTRAVENOUS | 0 refills | Status: DC
Start: 2017-08-04 — End: 2017-08-05
  Administered 2017-08-04 – 2017-08-05 (×3): 250.000 mL via INTRAVENOUS

## 2017-08-04 MED ORDER — DEXTROSE 50 % IN WATER (D50W) IV SOLP
50 mL | Freq: Once | INTRAVENOUS | 0 refills | Status: CP
Start: 2017-08-04 — End: ?
  Administered 2017-08-04: 22:00:00 50 mL via INTRAVENOUS

## 2017-08-04 MED ORDER — HYDROXYZINE HCL 25 MG PO TAB
25 mg | Freq: Three times a day (TID) | ORAL | 0 refills | Status: DC | PRN
Start: 2017-08-04 — End: 2017-08-06
  Administered 2017-08-04 – 2017-08-06 (×2): 25 mg via ORAL

## 2017-08-04 MED ORDER — INSULIN GLARGINE 100 UNIT/ML (3 ML) SC INJ PEN
20 [IU] | Freq: Every evening | SUBCUTANEOUS | 0 refills | Status: DC
Start: 2017-08-04 — End: 2017-08-04

## 2017-08-04 MED ORDER — INSULIN 100UNITS NS 100ML
1-32 [IU]/h | INTRAVENOUS | 0 refills | Status: DC
Start: 2017-08-04 — End: 2017-08-04

## 2017-08-04 MED ORDER — DEXTROSE 50 % IN WATER (D50W) IV SOLP
50 mL | Freq: Once | INTRAVENOUS | 0 refills | Status: CP
Start: 2017-08-04 — End: ?
  Administered 2017-08-04: 17:00:00 50 mL via INTRAVENOUS

## 2017-08-04 MED ORDER — IMS MIXTURE TEMPLATE
30 meq | Freq: Once | ORAL | 0 refills | Status: CN
Start: 2017-08-04 — End: ?

## 2017-08-04 MED ADMIN — DEXTROSE 50 % IN WATER (D50W) IV SOLP [86270]: 25 mL | INTRAVENOUS | @ 05:00:00 | Stop: 2017-08-04 | NDC 00409664802

## 2017-08-04 MED ADMIN — DEXTROSE 50 % IN WATER (D50W) IV SOLP [86270]: 50 mL | INTRAVENOUS | @ 15:00:00 | Stop: 2017-08-04 | NDC 00409664802

## 2017-08-04 NOTE — Consults
See previous note, forgot to check box for consult.    Jeannie FendBarbara Yolinda Duerr, BSN, RN  Clinical Nurse Coordinator - Diabetes Education  Nursing Clinical Excellence  [facility]  [email or web address]  [phone] office  [phone] pager
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
## 2017-08-04 NOTE — Response Teams
Rapid Response Team Progress Note    Date: 08/04/2017 Time: 5:46 PM  Patient: Bryan Bell  Attending: Georgiana ShoreAmos, Luke, MD Service: Med Private [phone]  Admission Date: 08/01/2017  LOS: 3 days    A Code/Rapid Response Timeline Event Report has been created for this patient on 08/04/17 at 1650. RRT called for hypoglycemia. Pt with recurrent hypoglycemia episodes since admission. Endocrine consulted. Pt's BS's stable for a couple of hours today, at which time plans were to initiate insulin gtt to determine insulin needs. Pt with continued low BS's, insulin gtt not started. D10 gtt re-ordered (see eMAR). Primary team updated endocrine team. MICU fellow contacted for further recommendations, suggests maintaining D10 gtt, as pt responded to it in the past. Primary team to consider CO vs safety watch, to assist with frequent BS checks and to ensure patient safety (concern from primary team that pt behavior, based off of recent comments, could potentially be contributing to hypoglycemic events). RRT to follow up in ~ 2hrs. Primary team encouraged to call RRT in the meantime if further assistance is needed.     The Attending physician, Dr. Danley Dankerarakji, was present at this event.    Claudie LeachLauren Georgana Romain, RN

## 2017-08-04 NOTE — Progress Notes
08/04/17 1054   POC Glucose   Hypoglycemia Treatment: Able to swallow, not NPO & conscious 8 oz juice (< 50 mg/dL)   Provider Notified YEs   Date Provider Notified 08/04/17   Time Provider Notified 1055  (Order for D50)     BG 36, Dr. Zenaida NieceAmos aware, see new orders

## 2017-08-04 NOTE — Progress Notes
Chaplain Note: Chaplain responded with BRT. Pt was agitated and did not want anyone in the room with him. Chaplain did not engage per [employer]'s digression.     Judene CompanionEmily Volanda Mangine, Chaplain Intern  PCU's 5  The spiritual care team is available as needed, 24/7, through the campus switchboard [phone]).  For immediate response, please page [phone].  For a response within 24 hours, please submit an order in O2 for a chaplain consult or call the administrative voicemail at [phone].

## 2017-08-04 NOTE — Progress Notes
Blood cultures and labs drawn from the right AC using Ultrasound and labeled at the bedside T. Storm Sovine VAT.

## 2017-08-04 NOTE — Progress Notes
Labs obtained by iv team. Labeled at bedside.

## 2017-08-04 NOTE — Other
Critical result or procedure called (document test and value, and read back): Blood glucose level @ 59  Time MD/NP Notified:  0624 via text page  MD/NP Name:  Med Private C team pager [phone]  MD/NP Response/Orders Given: Follow protocol per orders. Thanks

## 2017-08-04 NOTE — Progress Notes
IVT to bedside for PIV placement. During placement, [employer] arrive for bag search as patient continues to rapidly become hypoglycemic and staff is concerned for self-admin of insulin. Patient is also homeless at this time and concern is for purposeful extension of stay. Bag removed and assisted to search with [employer] supervision. No insulin located. Again, patient approached with [employer] supervisoin. No insulin observed in bedsheets after thorough search, nor in patient's shorts pockets. Patient continues to be agitated at staff concern.

## 2017-08-04 NOTE — Progress Notes
Patient off unit at this time to go outside and smoke.

## 2017-08-04 NOTE — Other
Critical result or procedure called (document test and value, and read back): Blood sugar 60  Time MD/NP Notified: MP-C paged at 2325  MD/NP Name: MP-C team pager [phone]  MD/NP Response/Orders Given: Proceed and give 1/2 amp of Dextrose since pt refuses 4 oz of OJ per protocol.
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
## 2017-08-04 NOTE — Progress Notes
Med private on the floor, okay to have a CO monitor patient, tech Gerilyn PilgrimJacob at bedside.     Med private also aware of elevated troponin, no new orders

## 2017-08-04 NOTE — Progress Notes
Pt diaphoretic, FSBS 50.  Multiple low blood sugars in past 24 hours.  Rapid response called.

## 2017-08-04 NOTE — Progress Notes
08/04/17 0832   POC Glucose   Hypoglycemia Treatment: Able to swallow, not NPO & conscious 8 oz juice (< 50 mg/dL)   Provider Notified YES  (Dr. Zenaida NieceAmos on the unit and rounding, D5 ordered and given)   Date Provider Notified 08/04/17   Time Provider Notified [phone]     FSBG 44

## 2017-08-04 NOTE — Progress Notes
2358 - Pt requesting to go out and smoke despite the fact that he is hypoglycemic and currently on a dextrose gtt. Patient has been educated on the effects of hypoglycemia and seems to understand the effects, but remains adamant on going out to smoke. Med Private C team paged on [phone]. Waiting call back.

## 2017-08-04 NOTE — Progress Notes
General Progress Note    Name:  Bryan Bell   ZOXWR'[id number] Date:  08/04/2017  Admission Date: 08/01/2017  LOS: 3 days                     Assessment/Plan:    Active Problems:    Wound, open, back    Uncontrolled type 2 diabetes mellitus with hyperglycemia, with long-term current use of insulin (HCC)    Hx of BKA, left (HCC)    Homelessness    Hypoglycemia associated with type 2 diabetes mellitus (HCC)    36yoM w/ PMH of HTN, DM1 c/b L BKA, COPD presenting with diffuse back pain in setting of open wound of left upper back s/p I&D at OSH and found to have hyperglycemia.    Changes made to today's assessment and plan are indicated in bold.  ???  Open wound of left upper back   - 0/4 SIRS   - ESR 50, CRP 0.6 (08/01/17)  - [place] 08/01/17 w/o abscess  - wound team consulted and recommending Vitamin A & D ointment, dry guaze in wound, cover with ABD pad, secure and change daily.  - continue bactrim for 10 days  - tylenol for pain, does not have an indication for opiates as he is requesting  ???  Hyperglycemia/Uncontrolled Type 2 diabetes  - HbA1C = 12.8   - he is not type 1 as originally reported as he has not filled any insulin for close to 3 months  - hypoglycemia continued throughout this morning  - endocrine consulted  - will d/c D10 now that his glucose is rising  - discussed w/ Dr. Margy Clarks, will place on insulin gtt and monitor for 24hr insulin need; clearly does not need as much insulin as he has reported in the past    Diarrhea - resolved  - had a normal BM today    COPD and tobacco abuse  - patient smokes 0.5 ppd  - patient denies any home COPD meds, no desire to stop smoking  ???  Normocytic anemia   - Previous Hb 15.0 (05/15/17), 13.2 (05/22/17)  - Hb 11.6 MCV 82.5 (08/02/17)  - Iron 43, %sat 13, TIBC 332, Ferritin 104 (08/02/17) - mildly iron deficient from poor diet  ???  HTN  - lisinopril 5mg  Qday    Chest pain  - EKG neg    FEN: no IVF, replace lytes PRN, regular diet  Ppx: lovenox  Code: Full Dispo: cont inpt admit, profoundly hypoglycemic this morning, has now finally resolved.  Endocrine consulted.   ________________________________________________________________________    Subjective  Bryan Bell is a 36 y.o. male.  Patient w/ ongoing pain in his back. Feeling weak and fatigued this morning.  He again reports taking 90u lantus BID and 42u Humalog with meals as outpt, but pharmacist called his pharmacy and he has not filled in 3 months.     ROS: neg for fevers, chills, dyspnea, chest pain, palpitations     Medications  Scheduled Meds:    enoxaparin (LOVENOX) syringe 40 mg 40 mg Subcutaneous QDAY(21)   lisinopril (PRINIVIL; ZESTRIL) tablet 5 mg 5 mg Oral QDAY   nicotine (NICODERM CQ STEP 3) 7 mg/day patch 1 patch 1 patch Transdermal QDAY   trimethoprim/sulfamethoxazole (BACTRIM DS) 160/800 mg tablet 1 tablet 1 tablet Oral BID   Continuous Infusions:  ??? insulin regular (NOVOLIN R) 100 Units in sodium chloride 0.9% (NS) 100 mL IV drip (std conc)  PRN and Respiratory Meds:acetaminophen Q8H PRN, vitamin A & D PRN      Objective                       Vital Signs: Last Filed                 Vital Signs: 24 Hour Range   BP: 139/83 (08/15 1309)  Temp: 36.4 ???C (97.6 ???F) (08/15 1230)  Pulse: 116 (08/15 1309)  Respirations: 22 PER MINUTE (08/15 1309)  SpO2: 100 % (08/15 1309)  O2 Delivery: None (Room Air) (08/15 1309)  SpO2 Pulse: 115 (08/15 1309) BP: (128-158)/(64-87)   Temp:  [36.2 ???C (97.2 ???F)-36.8 ???C (98.3 ???F)]   Pulse:  [97-116]   Respirations:  [16 PER MINUTE-27 PER MINUTE]   SpO2:  [99 %-100 %]   O2 Delivery: None (Room Air)   Intensity Pain Scale 0-10 (Pain 1): Asleep (eyes closed, laying in bed) (08/04/17 1330) Vitals:    08/01/17 1252 08/01/17 2155   Weight: 88.5 kg (195 lb) 98 kg (216 lb 0.8 oz)       Intake/Output Summary:  (Last 24 hours)    Intake/Output Summary (Last 24 hours) at 08/04/17 1453  Last data filed at 08/04/17 1100   Gross per 24 hour   Intake          1741.67 ml Output                0 ml   Net          1741.67 ml      Stool Occurrence: 1    Physical Exam  General:  Alert, cooperative, no distress, appears stated age.  Diaphoretic.   Head:  Normocephalic, without obvious abnormality, atraumatic.  Eyes:  Conjunctivae/corneas clear.    Lungs:  CTAB, no rales, no wheezing.  Heart:    Regular rate and rhythm, no MRG.  Abdomen:  Soft, non-tender.  Bowel sounds normal.    Extremities:  No cyanosis, clubbing, nor edema.  S/p L BKA.  Pulses:   2+ radial pulses b/l  Skin:   Multiple excoriations.  Two large wounds on his L scapula are covered, bandage is c/d/i    Lab Review  Hematology:    Lab Results   Component Value Date    HGB 12.0 08/04/2017    HCT 36.5 08/04/2017    PLTCT 425 08/04/2017    WBC 12.1 08/04/2017    NEUT 70 08/04/2017    ANC 8.40 08/04/2017    ALC 3.00 08/04/2017    MONA 4 08/04/2017    AMC 0.50 08/04/2017    ABC 0.00 08/04/2017    MCV 82.7 08/04/2017    MCHC 32.9 08/04/2017    MPV 7.4 08/04/2017    RDW 14.8 08/04/2017   , Coagulation:    Lab Results   Component Value Date    PTT 36.4 02/24/2016   , General Chemistry:    Lab Results   Component Value Date    NA 137 08/04/2017    K 3.3 08/04/2017    CL 106 08/04/2017    GAP 9 08/04/2017    BUN 14 08/04/2017    CR 0.80 08/04/2017    GLU 70 08/04/2017    CA 9.3 08/04/2017    ALBUMIN 3.7 08/04/2017    LACTIC 1.9 08/04/2017    MG 2.0 08/01/2017    TOTBILI 0.2 08/04/2017    and Enzymes:    Lab Results  Component Value Date    AST 12 08/04/2017    ALT 12 08/04/2017    ALKPHOS 94 08/04/2017       Point of Care Testing  (Last 24 hours)  Glucose: 70 (08/04/17 0335)  POC Glucose (Download): (!) 130 (08/04/17 1448)    Radiology and other Diagnostics Review:    No radiology    Georgiana Shore, MD   Pager [phone]

## 2017-08-04 NOTE — Consults
Diabetes Education:    Educator spoke to Dr. Zenaida NieceAmos that pt may benefit from Endocrine consult in setting of extreme hypoglycemia.    Jeannie FendBarbara Doretha Goding, BSN, RN  Clinical Nurse Coordinator - Diabetes Education  Nursing Clinical Excellence  [facility]  [email or web address]  [phone] office  [phone] pager

## 2017-08-04 NOTE — Progress Notes
0230 - Pt back in room safely. Dextrose gtt restarted. Will continue to monitor.

## 2017-08-04 NOTE — Progress Notes
08/04/17 1140   POC Glucose   Hypoglycemia Treatment: Able to swallow, not NPO & conscious 4 oz juice (50-70 mg/dL)   Provider Notified Yes   Date Provider Notified 08/04/17   Time Provider Notified 1150  (Dr. Zenaida NieceAmos on the unit and aware, order for D50)     BG 72 and dropping quickly, Dr. Zenaida NieceAmos aware, see new orders

## 2017-08-04 NOTE — Progress Notes
Med Private C team notified of potassium level @ 3.3

## 2017-08-04 NOTE — Progress Notes
08/04/17 1544   POC Glucose   Hypoglycemia Treatment: Able to swallow, not NPO & conscious 4 oz juice (50-70 mg/dL)   Provider Notified yes   Date Provider Notified 08/04/17   Time Provider Notified 1550  (Endo paged and aware, security paged, ADA diet after this me)     BG 70

## 2017-08-05 LAB — POC GLUCOSE
Lab: 115 mg/dL — ABNORMAL HIGH (ref 70–100)
Lab: 146 mg/dL — ABNORMAL HIGH (ref 70–100)
Lab: 185 mg/dL — ABNORMAL HIGH (ref 70–100)
Lab: 228 mg/dL — ABNORMAL HIGH (ref 70–100)
Lab: 201 mg/dL — ABNORMAL HIGH (ref 70–100)
Lab: 251 mg/dL — ABNORMAL HIGH (ref 70–100)
Lab: 236 mg/dL — ABNORMAL HIGH (ref 70–100)
Lab: 256 mg/dL — ABNORMAL HIGH (ref 70–100)
Lab: 381 mg/dL — ABNORMAL HIGH (ref 70–100)
Lab: 262 mg/dL — ABNORMAL HIGH (ref 70–100)
Lab: 307 mg/dL — ABNORMAL HIGH (ref 70–100)
Lab: 213 mg/dL — ABNORMAL HIGH (ref 70–100)
Lab: 224 mg/dL — ABNORMAL HIGH (ref 70–100)
Lab: 215 mg/dL — ABNORMAL HIGH (ref 70–100)
Lab: 208 mg/dL — ABNORMAL HIGH (ref 70–100)
Lab: 225 mg/dL — ABNORMAL HIGH (ref 70–100)
Lab: 211 mg/dL — ABNORMAL HIGH (ref 70–100)
Lab: 255 mg/dL — ABNORMAL HIGH (ref 70–100)
Lab: 271 mg/dL — ABNORMAL HIGH (ref 70–100)
Lab: 280 mg/dL — ABNORMAL HIGH (ref 70–100)

## 2017-08-05 LAB — BASIC METABOLIC PANEL
Lab: 134 MMOL/L — ABNORMAL LOW (ref 137–147)
Lab: 4.3 MMOL/L (ref 3.5–5.1)

## 2017-08-05 LAB — CBC
Lab: 12 10*3/uL — ABNORMAL HIGH (ref 4.5–11.0)
Lab: 12 g/dL — ABNORMAL LOW (ref 13.5–16.5)
Lab: 14 % (ref >60)
Lab: 329 K/UL (ref >60)
Lab: 33 g/dL (ref 32.0–36.0)
Lab: 7.3 FL (ref 7–11)
Lab: 80 FL (ref 80–100)

## 2017-08-05 LAB — TROPONIN-I: Lab: 0.2 ng/mL — ABNORMAL HIGH (ref 0.0–0.05)

## 2017-08-05 MED ORDER — INSULIN GLARGINE 100 UNIT/ML (3 ML) SC INJ PEN
20 [IU] | Freq: Every day | SUBCUTANEOUS | 0 refills | Status: DC
Start: 2017-08-05 — End: 2017-08-06
  Administered 2017-08-05: 16:00:00 20 [IU] via SUBCUTANEOUS

## 2017-08-05 MED ORDER — IBUPROFEN 600 MG PO TAB
600 mg | Freq: Once | ORAL | 0 refills | Status: CP
Start: 2017-08-05 — End: ?
  Administered 2017-08-06: 01:00:00 600 mg via ORAL

## 2017-08-05 MED ORDER — INSULIN ASPART 100 UNIT/ML SC FLEXPEN
0-14 [IU] | Freq: Before meals | SUBCUTANEOUS | 0 refills | Status: DC
Start: 2017-08-05 — End: 2017-08-06

## 2017-08-05 MED ORDER — INSULIN ASPART 100 UNIT/ML SC FLEXPEN
6 [IU] | Freq: Three times a day (TID) | SUBCUTANEOUS | 0 refills | Status: DC
Start: 2017-08-05 — End: 2017-08-05

## 2017-08-05 MED ORDER — LIDOCAINE 5 % TP PTMD
1 | Freq: Every day | TOPICAL | 0 refills | Status: DC
Start: 2017-08-05 — End: 2017-08-06

## 2017-08-05 MED ORDER — INSULIN ASPART 100 UNIT/ML SC FLEXPEN
4 [IU] | Freq: Once | SUBCUTANEOUS | 0 refills | Status: CP
Start: 2017-08-05 — End: ?

## 2017-08-05 MED ORDER — INSULIN ASPART 100 UNIT/ML SC FLEXPEN
10 [IU] | Freq: Three times a day (TID) | SUBCUTANEOUS | 0 refills | Status: DC
Start: 2017-08-05 — End: 2017-08-06

## 2017-08-05 NOTE — Progress Notes
Pt continues to refuse high fall bundle.

## 2017-08-05 NOTE — Progress Notes
Pt went off the unit before pt could be assessed.

## 2017-08-05 NOTE — Progress Notes
Pt back on the unit.

## 2017-08-05 NOTE — Progress Notes
General Progress Note    Name:  Bryan Bell   ONGEX'B Date:  08/05/2017  Admission Date: 08/01/2017  LOS: 4 days                     Assessment/Plan:    Active Problems:    Wound, open, back    Uncontrolled type 2 diabetes mellitus with hyperglycemia, with long-term current use of insulin (HCC)    Hx of BKA, left (HCC)    Homelessness    Hypoglycemia associated with type 2 diabetes mellitus (HCC)    36yoM w/ PMH of HTN, DM1 c/b L BKA, COPD presenting with diffuse back pain in setting of open wound of left upper back s/p I&D at OSH and found to have hyperglycemia.    Changes made to today's assessment and plan are indicated in bold.  ???  Open wound of left upper back   - 0/4 SIRS   - ESR 50, CRP 0.6 (08/01/17)  - [place] 08/01/17 w/o abscess  - wound team consulted and recommending Vitamin A & D ointment, dry guaze in wound, cover with ABD pad, secure and change daily.  - continue bactrim for 10 days  - tylenol for pain, does not have an indication for opiates as he is requesting  ???  Hyperglycemia/Uncontrolled Type 2 diabetes  - HbA1C = 12.8   - he is not type 1 as originally reported as he has not filled any insulin for close to 3 months  - is finally not hypoglycemic as lantus has worn off  - endocrine consulted  - D10 dc'd  - 20u lantus, 6u aspart TID AC ordered along w/ MDCF  - will likely need to increase based on PO intake today    Diarrhea - resolved  - had a normal BM today    COPD and tobacco abuse  - patient smokes 0.5 ppd  - patient denies any home COPD meds, no desire to stop smoking  ???  Normocytic anemia   - Previous Hb 15.0 (05/15/17), 13.2 (05/22/17)  - Hb 11.6 MCV 82.5 (08/02/17)  - Iron 43, %sat 13, TIBC 332, Ferritin 104 (08/02/17) - mildly iron deficient from poor diet  ???  HTN  - lisinopril 5mg  Qday    Chest pain  - EKG neg for ischemic changes  - trop elevated at 0.23, but pt refusing further labs besides FSBS    Scrotal pain  - U/S w/ calcifications likely from prior epididymitis FEN: no IVF, replace lytes PRN, regular diet  Ppx: lovenox  Code: Full    Dispo: cont inpt admit, is now finally hyperglycemic.  Discussed w/ Dr. Margy Clarks, will start on weight based insulin, but anticipate he will need more based on past needs.   ________________________________________________________________________    Subjective  Bryan Bell is a 36 y.o. male.  Patient w/ ongoing pain in his back. Is upset that a CO was placed overnight.  Is refusing more labs.  Back pain is unchanged.  Denies chest pain today.     ROS: neg for fevers, chills, dyspnea, chest pain, palpitations     Medications  Scheduled Meds:    enoxaparin (LOVENOX) syringe 40 mg 40 mg Subcutaneous QDAY(21)   insulin aspart U-100 (NOVOLOG FLEXPEN) injection PEN 0-14 Units 0-14 Units Subcutaneous ACHS   insulin aspart U-100 (NOVOLOG FLEXPEN) injection PEN 6 Units 6 Units Subcutaneous TID w/ meals   insulin glargine (LANTUS SOLOSTAR, BASAGLAR) injection PEN 20 Units 20 Units Subcutaneous QDAY  lidocaine (LIDODERM) 5 % topical patch 1 patch 1 patch Topical QDAY   lisinopril (PRINIVIL; ZESTRIL) tablet 5 mg 5 mg Oral QDAY   nicotine (NICODERM CQ STEP 3) 7 mg/day patch 1 patch 1 patch Transdermal QDAY   trimethoprim/sulfamethoxazole (BACTRIM DS) 160/800 mg tablet 1 tablet 1 tablet Oral BID   Continuous Infusions:    PRN and Respiratory Meds:acetaminophen Q8H PRN, hydrOXYzine TID PRN, vitamin A & D PRN      Objective                       Vital Signs: Last Filed                 Vital Signs: 24 Hour Range   BP: 124/74 (08/16 0725)  Temp: 36.9 ???C (98.5 ???F) (08/16 0725)  Pulse: 90 (08/16 0725)  Respirations: 20 PER MINUTE (08/16 0725)  SpO2: 100 % (08/16 0725)  O2 Delivery: None (Room Air) (08/16 0725)  SpO2 Pulse: 118 (08/16 0400) BP: (122-159)/(73-94)   Temp:  [36.4 ???C (97.6 ???F)-37.3 ???C (99.1 ???F)]   Pulse:  [90-118]   Respirations:  [20 PER MINUTE-27 PER MINUTE]   SpO2:  [99 %-100 %]   O2 Delivery: None (Room Air) Intensity Pain Scale 0-10 (Pain 1): 10 (08/04/17 1946) Vitals:    08/01/17 1252 08/01/17 2155   Weight: 88.5 kg (195 lb) 98 kg (216 lb 0.8 oz)       Intake/Output Summary:  (Last 24 hours)    Intake/Output Summary (Last 24 hours) at 08/05/17 0905  Last data filed at 08/05/17 1610   Gross per 24 hour   Intake          3299.58 ml   Output             3700 ml   Net          -400.42 ml      Stool Occurrence: 1    Physical Exam  General:  Alert, cooperative, no distress, appears stated age.   Head:  Normocephalic, without obvious abnormality, atraumatic.  Eyes:  Conjunctivae/corneas clear.    Lungs:  CTAB, no rales, no wheezing.  Heart:    Regular rate and rhythm, no MRG.  Abdomen:  Soft, non-tender.  Bowel sounds normal.    Extremities:  No cyanosis, clubbing, nor edema.  S/p L BKA.  Pulses:   2+ radial pulses b/l  Skin:   Multiple excoriations.  Two large wounds on his L scapula are covered, bandage is c/d/i    Lab Review  Hematology:    Lab Results   Component Value Date    HGB 12.0 08/04/2017    HCT 36.5 08/04/2017    PLTCT 425 08/04/2017    WBC 12.1 08/04/2017    NEUT 70 08/04/2017    ANC 8.40 08/04/2017    ALC 3.00 08/04/2017    MONA 4 08/04/2017    AMC 0.50 08/04/2017    ABC 0.00 08/04/2017    MCV 82.7 08/04/2017    MCHC 32.9 08/04/2017    MPV 7.4 08/04/2017    RDW 14.8 08/04/2017   , Coagulation:    Lab Results   Component Value Date    PTT 36.4 02/24/2016   , General Chemistry:    Lab Results   Component Value Date    NA 137 08/04/2017    K 3.3 08/04/2017    CL 106 08/04/2017    GAP 9 08/04/2017    BUN 14 08/04/2017  CR 0.80 08/04/2017    GLU 70 08/04/2017    CA 9.3 08/04/2017    ALBUMIN 3.7 08/04/2017    LACTIC 1.9 08/04/2017    MG 2.0 08/01/2017    TOTBILI 0.2 08/04/2017    and Enzymes:    Lab Results   Component Value Date    AST 12 08/04/2017    ALT 12 08/04/2017    ALKPHOS 94 08/04/2017       Point of Care Testing  (Last 24 hours)  POC Glucose (Download): (!) 211 (08/05/17 1610) Radiology and other Diagnostics Review:    US Scrotum Contents    Result Date: 08/05/2017  1.  Several tiny calcifications in both testicles, likely sequelae of prior infection or inflammation. Otherwise, normal sonographic appearance of the testes. 2.  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 By my electronic signature, I attest that I have personally reviewed the images for this examination and formulated the interpretations and opinions expressed in this report  Finalized by Ancil Boozer, M.D. on 08/05/2017 8:22 AM. Dictated by Denton Lank, M.D. on 08/05/2017 7:58 AM.       Georgiana Shore, MD   Pager [phone]

## 2017-08-05 NOTE — Progress Notes
Pt refuses telemetry, fall bundle and labs.  Tried to reinforce the importance of the continuous monitoring considering his illness, but pt unwilling to let staff reattach monitoring.  Dr Zenaida Niece informed.

## 2017-08-05 NOTE — Response Teams
RRT follow up completed at this time. BG checked while present-146. D10 gtt remains infusing per order. Pt awake and alert in bed with no complaints except for CO at bedside. Primary RN, Koleen NimrodAdrian, is comfortable with current plan for patient and will continue to monitor BG closely.

## 2017-08-05 NOTE — Progress Notes
Report given to Trey Paula, RN on unit 15.
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
## 2017-08-05 NOTE — ED Notes
Med Private C paged by this RN due to patient's FSBS of 381 at 0030. Per Med Private physician, D/C IV Dextrose and continue hourly blood sugar checks.

## 2017-08-05 NOTE — ED Notes
Report given to Melanie, RN

## 2017-08-06 ENCOUNTER — Encounter: Admit: 2017-08-06 | Discharge: 2017-08-06 | Payer: MEDICAID

## 2017-08-06 ENCOUNTER — Observation Stay: Admit: 2017-08-01 | Discharge: 2017-08-01 | Payer: MEDICAID

## 2017-08-06 ENCOUNTER — Inpatient Hospital Stay: Admit: 2017-08-06 | Discharge: 2017-08-06 | Payer: MEDICAID

## 2017-08-06 DIAGNOSIS — Z794 Long term (current) use of insulin: ICD-10-CM

## 2017-08-06 DIAGNOSIS — D649 Anemia, unspecified: ICD-10-CM

## 2017-08-06 DIAGNOSIS — N5082 Scrotal pain: ICD-10-CM

## 2017-08-06 DIAGNOSIS — J449 Chronic obstructive pulmonary disease, unspecified: ICD-10-CM

## 2017-08-06 DIAGNOSIS — E10319 Type 1 diabetes mellitus with unspecified diabetic retinopathy without macular edema: ICD-10-CM

## 2017-08-06 DIAGNOSIS — E10649 Type 1 diabetes mellitus with hypoglycemia without coma: ICD-10-CM

## 2017-08-06 DIAGNOSIS — E876 Hypokalemia: ICD-10-CM

## 2017-08-06 DIAGNOSIS — Z79899 Other long term (current) drug therapy: ICD-10-CM

## 2017-08-06 DIAGNOSIS — Z89512 Acquired absence of left leg below knee: ICD-10-CM

## 2017-08-06 DIAGNOSIS — I10 Essential (primary) hypertension: ICD-10-CM

## 2017-08-06 DIAGNOSIS — F17210 Nicotine dependence, cigarettes, uncomplicated: ICD-10-CM

## 2017-08-06 DIAGNOSIS — G8929 Other chronic pain: ICD-10-CM

## 2017-08-06 DIAGNOSIS — E1040 Type 1 diabetes mellitus with diabetic neuropathy, unspecified: ICD-10-CM

## 2017-08-06 DIAGNOSIS — R079 Chest pain, unspecified: ICD-10-CM

## 2017-08-06 DIAGNOSIS — Z590 Homelessness: ICD-10-CM

## 2017-08-06 DIAGNOSIS — E1065 Type 1 diabetes mellitus with hyperglycemia: ICD-10-CM

## 2017-08-06 DIAGNOSIS — S21202A Unspecified open wound of left back wall of thorax without penetration into thoracic cavity, initial encounter: Principal | ICD-10-CM

## 2017-08-06 DIAGNOSIS — R197 Diarrhea, unspecified: ICD-10-CM

## 2017-08-06 DIAGNOSIS — S21209A Unspecified open wound of unspecified back wall of thorax without penetration into thoracic cavity, initial encounter: ICD-10-CM

## 2017-08-06 LAB — POC GLUCOSE
Lab: 368 mg/dL — ABNORMAL HIGH (ref >60)
Lab: 360 mg/dL — ABNORMAL HIGH (ref 70–100)
Lab: 261 mg/dL — ABNORMAL HIGH (ref 70–100)

## 2017-08-06 LAB — CBC: Lab: 9.4 K/UL — ABNORMAL LOW (ref >60)

## 2017-08-06 LAB — BASIC METABOLIC PANEL: Lab: 136 MMOL/L — ABNORMAL LOW (ref 137–147)

## 2017-08-06 MED ORDER — BLOOD SUGAR DIAGNOSTIC MISC STRP
1 | ORAL_STRIP | Freq: Before meals | 3 refills | 30.00000 days | Status: AC
Start: 2017-08-06 — End: 2018-01-17
  Filled 2017-08-06 (×2): qty 300, 25d supply

## 2017-08-06 MED ORDER — INSULIN ASPART 100 UNIT/ML SC FLEXPEN
15 [IU] | Freq: Three times a day (TID) | SUBCUTANEOUS | 3 refills | Status: SS
Start: 2017-08-06 — End: 2017-10-04

## 2017-08-06 MED ORDER — BLOOD-GLUCOSE METER MISC KIT
PACK | 0 refills | 50.00000 days | Status: AC
Start: 2017-08-06 — End: 2018-01-17
  Filled 2017-08-06: qty 1, 1d supply

## 2017-08-06 MED ORDER — PEN NEEDLE, DIABETIC 32 GAUGE X 5/32" MISC NDLE
1 | 3 refills | 30.00000 days | Status: AC | PRN
Start: 2017-08-06 — End: 2018-01-17

## 2017-08-06 MED ORDER — INSULIN GLARGINE 100 UNIT/ML (3 ML) SC INJ PEN
30 [IU] | Freq: Every day | SUBCUTANEOUS | 3 refills | Status: SS
Start: 2017-08-06 — End: 2017-10-04

## 2017-08-06 MED ORDER — INSULIN GLARGINE 100 UNIT/ML (3 ML) SC INJ PEN
30 [IU] | Freq: Every day | SUBCUTANEOUS | 0 refills | Status: DC
Start: 2017-08-06 — End: 2017-08-06

## 2017-08-06 MED ORDER — INSULIN ASPART 100 UNIT/ML SC FLEXPEN
15 [IU] | Freq: Three times a day (TID) | SUBCUTANEOUS | 0 refills | Status: DC
Start: 2017-08-06 — End: 2017-08-06

## 2017-08-06 MED ORDER — LANCETS 30 GAUGE MISC MISC
1 | Freq: Four times a day (QID) | 11 refills | Status: AC | PRN
Start: 2017-08-06 — End: 2018-01-17
  Filled 2017-08-06: qty 300, 25d supply

## 2017-08-06 MED ORDER — NICOTINE 7 MG/24 HR TD PT24
1 | MEDICATED_PATCH | Freq: Every day | TRANSDERMAL | 0 refills | Status: AC
Start: 2017-08-06 — End: ?

## 2017-08-06 MED ORDER — ACETAMINOPHEN 500 MG PO TAB
1000 mg | ORAL | 0 refills | Status: SS | PRN
Start: 2017-08-06 — End: 2017-10-04

## 2017-08-06 MED FILL — INSULIN ASPART 100 UNIT/ML SC FLEXPEN: 100 unit/mL (3 mL) | SUBCUTANEOUS | 33 days supply | Qty: 45 | Status: CN

## 2017-08-06 MED FILL — INSULIN GLARGINE 100 UNIT/ML (3 ML) SC INJ PEN: 100 unit/mL (3 mL) | SUBCUTANEOUS | 50 days supply | Qty: 45 | Status: CN

## 2017-08-06 NOTE — Progress Notes
.  Bryan Bell discharged on 08/06/2017.   Marland Kitchen  Discharge instructions reviewed with patient.  Valuables returned:   Personal Items / Valuables: Valuables/Belongings home with patient.  Home medications:    .  Functional assessment at discharge complete: Yes .    Pt confirmed understanding of discharge packet.  Peripheral IVs removed prior to D/C. Pt D/C to front entrance of hospital via wheelchair.

## 2017-08-06 NOTE — Progress Notes
Day of Discharge Progress Note    S:   Pt apparently had an event where he fell in the bathroom early this morning.  CT negative for acute changes.  He was verbally abusive to several staff members this morning.  When I visited he was quite upset opiates weren't being given for his chronic pain and reported that his pain was so severe he had been unable to sleep for 24 hours despite that I had seen him sleeping most of prior day.  I again explained that w/o a PCP to manage ongoing opiates and that he did not have a clear indication for opiates I would not be prescribing these.  He became enraged and pulled out his b/l PIV's and stated he was leaving.  He later approached me in the hall and apologized for his behavior.  I re-visited him in his room and explained that I would be discharging him now that his glucose was doing better.  He has a frio cooler to help keep his insulin cold.  We will voucher his lantus and novolog.  Rx for low cost glucometer will also be provided as I requested that he monitor his glucose semi-regularly to prevent hypoglycemic episodes.  He calmly agreed to this plan.      O:   Exam unchanged from day prior.    Vitals:    08/05/17 1945 08/05/17 2256 08/06/17 0225 08/06/17 0735   BP: 129/69 131/84 127/69 132/83   Pulse: 120 109 114 115   Temp: 36.6 C (97.8 F) 36.4 C (97.6 F) 37.2 C (98.9 F) 36.5 C (97.7 F)   SpO2: 100% 100% 100% 100%   Weight:       Height:         Labs reviewed.  No longer hypoglycemic, now having hyperglycemia.     A/P:    Hyperglycemia/Uncontrolled Type 2 diabetes  - HbA1C = 12.8   - endocrine consulted  - now having hyperglycemia  - lantus increased to 30u, aspart increased to 15u TID AC    B/l back wounds  - cont local wound care    35 min spent in discharge planning including counseling on medications, arranging follow up appointments, and discussing return precautions.        Georgiana Shore, MD  Pager:  [phone]    No future appointments.

## 2017-08-06 NOTE — Progress Notes
.  Time of Fall:  0725    Objective (location/activity at time):  Patient found on shower floor. Patient assisted to wheelchair from shower floor, then assisted to bed with x3 assist.    Subjective:  Patient states, "I was in the shower, stood up from the shower chair and my foot slipped.  I hit the L side of my head and now my neck hurts."  States did not lose consciousness.    MD/NP and Family Notified: Dr. Alonna Minium    Assessment:  Oriented x4, PERRL. VSS.  Clear communication, denies numbness and tingling    Follow-up Action (Diagnostic/Additional Safety Precautions):  Provider ordered CT of head.  Pt had been previously been refusing fall bundle.  Pt reeducated on importance of fall bundle and is agreeable to wear yellow sock and yellow wristband.  Bed alarm on.

## 2017-08-06 NOTE — Progress Notes
Patient educated on fall bundle. Patient refuses bundle at this time. Patient at baseline mobility with wheelchair.

## 2017-08-06 NOTE — Consults
Spoke with Dr. Norville Haggard. Pt will need a Rx for below meter.     Please provide patient with the following prescriptions at discharge:  [place].  Do not select by the blue product names.  Include on each script the type of diabetes, uncontrolled or controlled, requiring insulin or oral hypoglycemics.    Blood glucose meter (free at [facility]) Glucocard Expression Kit ( meter, case, log book, 10 lancets, 10 test strips, & control solution)  [id number]    Inexpensive test strips Glucocard Expression test strips; Qty 50  [id number]     Test as directed. Dispense number needed/mo.    Monolet Lancets (generic) use with FreeStyle & Glucocard (Lancets Misc) # 100 per box. 3154        Appreciate consult on this patient.    If any further needs please consult diabetic nurse educators (Team pager [phone])    Parke Poisson, RN, MSN, CMSRN, CDE  Pager: [phone] 08:00-4:30 weekdays, If no response, please call team pager [phone])  Office: [phone]  Diabetic Education Team office [phone])

## 2017-08-10 LAB — CULTURE-BLOOD W/SENSITIVITY

## 2017-08-11 LAB — GLUTAMIC ACID DECARBOXYLASE: Lab: 0

## 2017-08-16 ENCOUNTER — Encounter: Admit: 2017-08-16 | Discharge: 2017-08-16 | Payer: MEDICAID

## 2017-08-25 ENCOUNTER — Encounter: Admit: 2017-08-25 | Discharge: 2017-08-25 | Payer: MEDICAID

## 2017-08-25 DIAGNOSIS — I10 Essential (primary) hypertension: ICD-10-CM

## 2017-08-25 DIAGNOSIS — E119 Type 2 diabetes mellitus without complications: Principal | ICD-10-CM

## 2017-08-25 DIAGNOSIS — R079 Chest pain, unspecified: Principal | ICD-10-CM

## 2017-08-25 DIAGNOSIS — E785 Hyperlipidemia, unspecified: ICD-10-CM

## 2017-08-25 DIAGNOSIS — J449 Chronic obstructive pulmonary disease, unspecified: ICD-10-CM

## 2017-08-25 DIAGNOSIS — F99 Mental disorder, not otherwise specified: ICD-10-CM

## 2017-08-26 ENCOUNTER — Encounter: Admit: 2017-08-26 | Discharge: 2017-08-26 | Payer: MEDICAID

## 2017-08-26 ENCOUNTER — Emergency Department: Admit: 2017-08-26 | Discharge: 2017-08-26 | Disposition: A | Discharge status: Home / Self Care

## 2017-08-26 ENCOUNTER — Emergency Department: Admit: 2017-08-26 | Discharge: 2017-08-26 | Payer: MEDICAID

## 2017-08-26 DIAGNOSIS — L089 Local infection of the skin and subcutaneous tissue, unspecified: ICD-10-CM

## 2017-08-26 DIAGNOSIS — B9562 Methicillin resistant Staphylococcus aureus infection as the cause of diseases classified elsewhere: ICD-10-CM

## 2017-08-26 DIAGNOSIS — R079 Chest pain, unspecified: Principal | ICD-10-CM

## 2017-08-26 LAB — COMPREHENSIVE METABOLIC PANEL
Lab: 0.4 mg/dL (ref 0.3–1.2)
Lab: 0.7 mg/dL (ref 0.4–1.24)
Lab: 13 U/L (ref 7–40)
Lab: 13 U/L (ref 7–56)
Lab: 134 MMOL/L — ABNORMAL LOW (ref 137–147)
Lab: 25 MMOL/L (ref 21–30)
Lab: 372 mg/dL — ABNORMAL HIGH (ref 70–100)
Lab: 5 mg/dL — ABNORMAL LOW (ref 7–25)
Lab: 7 10*3/uL (ref 3–12)
Lab: 7.3 g/dL (ref 6.0–8.0)
Lab: 9.1 mg/dL — ABNORMAL HIGH (ref 8.5–10.6)
Lab: 99 U/L (ref 25–110)
Lab: >60 mL/min (ref >60)

## 2017-08-26 LAB — CBC AND DIFF
Lab: 0 10*3/uL (ref 0–0.20)
Lab: 7.1 10*3/uL (ref 4.5–11.0)

## 2017-08-26 LAB — PTT (APTT): Lab: 29 s — ABNORMAL LOW (ref 20.0–36.0)

## 2017-08-26 LAB — POC TROPONIN: Lab: 0 ng/mL (ref 0.00–0.05)

## 2017-08-26 LAB — PROTIME INR (PT): Lab: 1 MMOL/L — ABNORMAL LOW (ref 0.8–1.2)

## 2017-08-26 LAB — D-DIMER: Lab: 955 ng{FEU}/mL — ABNORMAL HIGH (ref <500)

## 2017-08-26 MED ORDER — DOXYCYCLINE HYCLATE 100 MG PO TAB
100 mg | ORAL_TABLET | Freq: Two times a day (BID) | ORAL | 0 refills | 8.00000 days | Status: AC
Start: 2017-08-26 — End: ?

## 2017-08-26 MED ORDER — IOPAMIDOL 76 % IV SOLN
70 mL | Freq: Once | INTRAVENOUS | 0 refills | Status: CP
Start: 2017-08-26 — End: ?
  Administered 2017-08-26: 09:00:00 70 mL via INTRAVENOUS

## 2017-08-26 MED ORDER — FENTANYL CITRATE (PF) 50 MCG/ML IJ SOLN
50 ug | Freq: Once | INTRAVENOUS | 0 refills | Status: CP
Start: 2017-08-26 — End: ?
  Administered 2017-08-26: 07:00:00 50 ug via INTRAVENOUS

## 2017-08-26 MED ORDER — SODIUM CHLORIDE 0.9 % IJ SOLN
50 mL | Freq: Once | INTRAVENOUS | 0 refills | Status: CP
Start: 2017-08-26 — End: ?
  Administered 2017-08-26: 09:00:00 50 mL via INTRAVENOUS

## 2017-08-26 NOTE — ED Notes
DC instructions reviewed w/ pt. Pt acknowledges understanding of DC instructions. VSS. PIV removed. All belongings w/ pt.

## 2017-08-26 NOTE — ED Notes
Pt presents to the ED via personal wheelchair c/ c/o chest pain x this AM. Pt reports attempting to "sleep it off" w/ no improvement. Pt describes chest pain as "jolting" and intermittent. Pt reports chest pain L side of chest and is non-radiating. Pt reports smoking cigarettes makes pain worse. Pt denies SOA, nausea/vomiting. Pt does endorse lightheadedness and fatigue. Pt denies any other complaints. 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 Pt does have open/draining wounds noted to back. Pt does reports sweats/chills. Pt is AO x 4. Airway intact, breathing non-labored, pulses palpable. Pt placed on cardiac monitor. VSS. Bed in low position, side-rails raised, call light in reach.    Belongings: Personal wheelchair, cell phone, backpack w/ clothing, wallet, $1

## 2017-09-18 ENCOUNTER — Emergency Department: Admit: 2017-09-18 | Discharge: 2017-09-18 | Payer: MEDICAID

## 2017-09-18 ENCOUNTER — Emergency Department: Admit: 2017-09-18 | Discharge: 2017-09-19 | Disposition: A | Discharge status: Home / Self Care

## 2017-09-18 DIAGNOSIS — M7989 Other specified soft tissue disorders: ICD-10-CM

## 2017-09-18 LAB — COMPREHENSIVE METABOLIC PANEL
Lab: 0.3 mg/dL (ref 0.3–1.2)
Lab: 0.7 mg/dL (ref 0.4–1.24)
Lab: 10 mg/dL (ref 7–25)
Lab: 101 MMOL/L — ABNORMAL LOW (ref 98–110)
Lab: 136 MMOL/L — ABNORMAL LOW (ref 137–147)
Lab: 292 mg/dL — ABNORMAL HIGH (ref 70–100)
Lab: 3.3 MMOL/L — ABNORMAL LOW (ref 3.5–5.1)
Lab: 3.7 g/dL — ABNORMAL HIGH (ref 3.5–5.0)
Lab: 7.5 g/dL (ref 6.0–8.0)
Lab: 9.2 mg/dL (ref 8.5–10.6)

## 2017-09-18 LAB — POC LACTATE: Lab: 2.1 MMOL/L — ABNORMAL HIGH (ref 0.5–2.0)

## 2017-09-18 LAB — CBC AND DIFF: Lab: 12 10*3/uL — ABNORMAL HIGH (ref 4.5–11.0)

## 2017-09-18 MED ORDER — ACETAMINOPHEN 500 MG PO TAB
1000 mg | Freq: Once | ORAL | 0 refills | Status: DC
Start: 2017-09-18 — End: 2017-09-18

## 2017-09-18 MED ORDER — ACETAMINOPHEN 500 MG PO TAB
1000 mg | Freq: Once | ORAL | 0 refills | Status: CP
Start: 2017-09-18 — End: ?
  Administered 2017-09-18: 23:00:00 1000 mg via ORAL

## 2017-09-18 MED ORDER — FENTANYL CITRATE (PF) 50 MCG/ML IJ SOLN
25 ug | INTRAVENOUS | 0 refills | Status: DC | PRN
Start: 2017-09-18 — End: 2017-09-19
  Administered 2017-09-19: 25 ug via INTRAVENOUS

## 2017-09-18 MED ORDER — DOXYCYCLINE HYCLATE 100 MG PO TAB
100 mg | Freq: Once | ORAL | 0 refills | Status: CP
Start: 2017-09-18 — End: ?
  Administered 2017-09-19: 01:00:00 100 mg via ORAL

## 2017-09-18 MED ORDER — LIDOCAINE-RACEPINEP-TETRACAINE 4-0.05-0.5 % TP GEL
Freq: Once | TOPICAL | 0 refills | Status: CP
Start: 2017-09-18 — End: ?
  Administered 2017-09-18: 3.000 mL via TOPICAL

## 2017-09-18 MED ORDER — CHLORHEXIDINE GLUCONATE 4 % TP LIQD
Freq: Every day | 0 refills | Status: SS
Start: 2017-09-18 — End: 2017-10-04

## 2017-09-18 MED ORDER — LIDOCAINE-EPINEPHRINE 1 %-1:100,000 IJ SOLN
30 mL | Freq: Once | INTRAMUSCULAR | 0 refills | Status: DC
Start: 2017-09-18 — End: 2017-09-19

## 2017-09-18 MED ORDER — LIDOCAINE-EPINEPHRINE 1 %-1:100,000 IJ SOLN
20 mL | Freq: Once | INTRAMUSCULAR | 0 refills | Status: DC
Start: 2017-09-18 — End: 2017-09-19

## 2017-09-18 MED ORDER — DOXYCYCLINE MONOHYDRATE 100 MG PO TAB
100 mg | ORAL_TABLET | Freq: Two times a day (BID) | ORAL | 0 refills | 8.00000 days | Status: AC
Start: 2017-09-18 — End: ?

## 2017-09-18 NOTE — ED Notes
Pt comes in via POV with c/o a L arm abscess/wound since Thursday. Pt states he was seen at [facility] but left because he was angry. Pt states he has a hx of DM and HTN. Pt has a BTK amputation to left leg. Pt denies fevers or chills. States he has a throbbing pain in his arm. Pt has a a raised erythematous area to posterior forearm and an abrasion. Pt is A&Ox4. Pt is hooked to BP and O2 monitor. Awaiting MD eval.     Belongings: shirt, pants, shoes, socks, gloves, glasses, hat, and wheelchair.

## 2017-09-19 DIAGNOSIS — L02414 Cutaneous abscess of left upper limb: Principal | ICD-10-CM

## 2017-09-19 DIAGNOSIS — Z590 Homelessness: ICD-10-CM

## 2017-09-19 LAB — GRAM STAIN

## 2017-09-19 NOTE — ED Notes
Pt given d/c instructions and prescriptions. Pt has no further questions at this time. Pt is A&Ox4.
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
## 2017-09-21 LAB — CULTURE-WOUND/TISSUE/FLUID(AEROBIC ONLY)W/SENSITIVITY

## 2017-09-22 ENCOUNTER — Encounter: Admit: 2017-09-22 | Discharge: 2017-09-22 | Payer: MEDICAID

## 2017-09-22 NOTE — Progress Notes
Culture sensitivity report demonstrates efficacy of prescribed antibiotic, no further action required.

## 2017-09-23 LAB — CULTURE-ANAEROBIC

## 2017-09-26 ENCOUNTER — Encounter: Admit: 2017-09-26 | Discharge: 2017-09-26 | Payer: MEDICAID

## 2017-09-26 ENCOUNTER — Emergency Department: Admit: 2017-09-26 | Discharge: 2017-09-27 | Disposition: A | Discharge status: Home / Self Care

## 2017-09-26 ENCOUNTER — Emergency Department: Admit: 2017-09-26 | Discharge: 2017-09-26 | Payer: MEDICAID

## 2017-09-26 DIAGNOSIS — J449 Chronic obstructive pulmonary disease, unspecified: ICD-10-CM

## 2017-09-26 DIAGNOSIS — E785 Hyperlipidemia, unspecified: ICD-10-CM

## 2017-09-26 DIAGNOSIS — F99 Mental disorder, not otherwise specified: ICD-10-CM

## 2017-09-26 DIAGNOSIS — I10 Essential (primary) hypertension: ICD-10-CM

## 2017-09-26 DIAGNOSIS — E119 Type 2 diabetes mellitus without complications: Principal | ICD-10-CM

## 2017-09-26 LAB — POC GLUCOSE
Lab: 577 mg/dL — ABNORMAL HIGH (ref 70–100)
Lab: 366 mg/dL — ABNORMAL HIGH (ref 70–100)

## 2017-09-26 LAB — URINALYSIS DIPSTICK
Lab: NEGATIVE MMOL/L (ref 21–30)
Lab: NEGATIVE U/L (ref 7–40)
Lab: NEGATIVE U/L (ref 7–56)
Lab: NEGATIVE g/dL (ref 3.5–5.0)
Lab: NEGATIVE g/dL — ABNORMAL HIGH (ref 6.0–8.0)
Lab: NEGATIVE mg/dL (ref 0.3–1.2)

## 2017-09-26 LAB — COMPREHENSIVE METABOLIC PANEL
Lab: 12 10*3/uL (ref 3–12)
Lab: 131 MMOL/L — ABNORMAL LOW (ref 137–147)
Lab: >60 mL/min (ref >60)
Lab: >60 mL/min (ref >60)

## 2017-09-26 LAB — URINALYSIS, MICROSCOPIC

## 2017-09-26 LAB — LACTIC ACID (BG - RAPID LACTATE): Lab: 1.6 MMOL/L (ref 0.5–2.0)

## 2017-09-26 LAB — CBC AND DIFF
Lab: 0 10*3/uL (ref 0–0.20)
Lab: 0.1 10*3/uL (ref 0–0.45)
Lab: 8.2 10*3/uL (ref 4.5–11.0)

## 2017-09-26 LAB — BETA HYDROXYBUTYRATE (KETONES): Lab: 0.1 MMOL/L (ref <0.3)

## 2017-09-26 LAB — OSMOLALITY: Lab: 311 mosm/kg — ABNORMAL HIGH (ref 280–307)

## 2017-09-26 MED ORDER — IOPAMIDOL 76 % IV SOLN
100 mL | Freq: Once | INTRAVENOUS | 0 refills | Status: CP
Start: 2017-09-26 — End: ?
  Administered 2017-09-26: 20:00:00 100 mL via INTRAVENOUS

## 2017-09-26 MED ORDER — FENTANYL CITRATE (PF) 50 MCG/ML IJ SOLN
50 ug | INTRAVENOUS | 0 refills | Status: DC | PRN
Start: 2017-09-26 — End: 2017-09-27
  Administered 2017-09-26 (×2): 50 ug via INTRAVENOUS

## 2017-09-26 MED ORDER — SODIUM CHLORIDE 0.9 % IV SOLP
1000 mL | INTRAVENOUS | 0 refills | Status: CP
Start: 2017-09-26 — End: ?
  Administered 2017-09-26: 20:00:00 1000 mL via INTRAVENOUS

## 2017-09-26 MED ORDER — SODIUM CHLORIDE 0.9 % IJ SOLN
50 mL | Freq: Once | INTRAVENOUS | 0 refills | Status: CP
Start: 2017-09-26 — End: ?
  Administered 2017-09-26: 20:00:00 50 mL via INTRAVENOUS

## 2017-09-26 NOTE — ED Notes
All belongings gathered and placed in belonging bag with patient labels at bedside.  The bag(s) contain(s) the following:    Clothing: sweatshirt x 2, shorts x1, underwear, sock x 1, shoe x 1  Shoes: sneaker x1  Jewelry: watch (not working)  Careers adviser: [place] state ID  Cash: $1.50 change  Credit Cards: non  Electronics: 2 cell phones, 2 chargers, digital diary  Dentures/Glasses/Hearing aids: none  Assistive devices: Wheelchair  Other: backpack (grey/black)    All belongings placed in 0 bag(s). All clothing is wet and drying currently    Belongings disposition: bedside (pt is homeless)

## 2017-09-26 NOTE — ED Notes
Pt to sono for [place].

## 2017-09-26 NOTE — ED Notes
The following critical results or reading was called to the Emergency Department for Bryan Bell.  Results/Reading: BG 577  Person receiving results/reading: M. Wyn Forster, RN  Results given to: Dr. Jimmey Ralph  Time Notified: [phone]

## 2017-09-26 NOTE — Other
Critical result or procedure called (document test and value, and read back):  BG 629  Time MD/NP Notified:  1357  MD/NP Name:  Dr. Jimmey Ralph  MD/NP Response/Orders Given:  Labs ordered.

## 2017-09-26 NOTE — ED Triage Notes
Pt to [facility] ED with c/o scrotal pain 10/10 throbbing radiating to right hip.  Pt states this has been going on for a few week. Pt states that his testicles have been retracting into abdomen and is painful at the time currently retracted.   Pt having difficulties transferring d/t pain and pt states he feels as though he is going to "pass out" when trying to stand or kneel. Pt states not using any pain medications for pain.  Some burning off/on for past week with voiding. Voiding without difficulty.  Pt state diarrhea x 48 hours. Denies abdominal pain.    Pt currently on ABX for multiple skin infections. Multiple open wounds/healing wounds as well as erythematous wounds on arm and back. Pt states multiple surgical interventions over past year (NOV 17).    Alert and orient. Pt is a BKA and utilized WC.  Denies fever.   Pt has fallen out of WC in past. Denies LOC or hit head.     Pt has not been taking insulin or checking BG.   Current smoker. 2 PPD.   Denies alcohol or drug use.

## 2017-09-27 DIAGNOSIS — Z590 Homelessness: ICD-10-CM

## 2017-09-27 DIAGNOSIS — N5082 Scrotal pain: Principal | ICD-10-CM

## 2017-09-27 DIAGNOSIS — Z794 Long term (current) use of insulin: ICD-10-CM

## 2017-09-27 DIAGNOSIS — E1165 Type 2 diabetes mellitus with hyperglycemia: ICD-10-CM

## 2017-09-27 DIAGNOSIS — Z89512 Acquired absence of left leg below knee: ICD-10-CM

## 2017-09-27 LAB — CHLAM/NG PCR URINE
Lab: NEGATIVE
Lab: NEGATIVE

## 2017-10-03 ENCOUNTER — Inpatient Hospital Stay: Admit: 2017-10-03 | Discharge: 2017-10-03 | Payer: MEDICAID

## 2017-10-03 ENCOUNTER — Emergency Department: Admit: 2017-10-03 | Discharge: 2017-10-03 | Payer: MEDICAID

## 2017-10-03 ENCOUNTER — Encounter: Admit: 2017-10-03 | Discharge: 2017-10-03 | Payer: MEDICAID

## 2017-10-03 DIAGNOSIS — F99 Mental disorder, not otherwise specified: ICD-10-CM

## 2017-10-03 DIAGNOSIS — J449 Chronic obstructive pulmonary disease, unspecified: ICD-10-CM

## 2017-10-03 DIAGNOSIS — E785 Hyperlipidemia, unspecified: ICD-10-CM

## 2017-10-03 DIAGNOSIS — M79604 Pain in right leg: ICD-10-CM

## 2017-10-03 DIAGNOSIS — I10 Essential (primary) hypertension: ICD-10-CM

## 2017-10-03 DIAGNOSIS — R739 Hyperglycemia, unspecified: ICD-10-CM

## 2017-10-03 DIAGNOSIS — E119 Type 2 diabetes mellitus without complications: Principal | ICD-10-CM

## 2017-10-03 LAB — COMPREHENSIVE METABOLIC PANEL: Lab: 135 MMOL/L — ABNORMAL LOW (ref 137–147)

## 2017-10-03 LAB — BETA HYDROXYBUTYRATE (KETONES): Lab: 0.2 MMOL/L (ref <0.3)

## 2017-10-03 LAB — CBC AND DIFF
Lab: 4.6 M/UL (ref 4.4–5.5)
Lab: 7.6 10*3/uL (ref 4.5–11.0)

## 2017-10-03 LAB — POC GLUCOSE: Lab: 390 mg/dL — ABNORMAL HIGH (ref 70–100)

## 2017-10-03 MED ORDER — DIPHTH,PERTUS(ACELL),TETANUS 2.5-8-5 LF-MCG-LF/0.5ML IM SUSP
.5 mL | Freq: Once | INTRAMUSCULAR | 0 refills | Status: CP
Start: 2017-10-03 — End: ?
  Administered 2017-10-04: 17:00:00 0.5 mL via INTRAMUSCULAR

## 2017-10-03 MED ORDER — POLYETHYLENE GLYCOL 3350 17 GRAM PO PWPK
1 | Freq: Two times a day (BID) | ORAL | 0 refills | Status: DC | PRN
Start: 2017-10-03 — End: 2017-10-05

## 2017-10-03 MED ORDER — ACETAMINOPHEN 500 MG PO TAB
1000 mg | Freq: Once | ORAL | 0 refills | Status: CP
Start: 2017-10-03 — End: ?
  Administered 2017-10-03: 23:00:00 1000 mg via ORAL

## 2017-10-03 MED ORDER — LIDOCAINE-EPINEPHRINE 1 %-1:100,000 IJ SOLN
20 mL | Freq: Once | INTRAMUSCULAR | 0 refills | Status: AC
Start: 2017-10-03 — End: ?

## 2017-10-03 MED ORDER — LIDOCAINE-EPINEPHRINE 1 %-1:100,000 IJ SOLN
30 mL | Freq: Once | INTRAMUSCULAR | 0 refills | Status: DC
Start: 2017-10-03 — End: 2017-10-04

## 2017-10-03 MED ORDER — HYDROCODONE-ACETAMINOPHEN 5-325 MG PO TAB
1-2 | ORAL | 0 refills | Status: DC | PRN
Start: 2017-10-03 — End: 2017-10-04

## 2017-10-03 MED ORDER — LACTATED RINGERS IV SOLP
1000 mL | Freq: Once | INTRAVENOUS | 0 refills | Status: CP
Start: 2017-10-03 — End: ?
  Administered 2017-10-03: 23:00:00 1000 mL via INTRAVENOUS

## 2017-10-03 MED ORDER — ONDANSETRON HCL (PF) 4 MG/2 ML IJ SOLN
4 mg | INTRAVENOUS | 0 refills | Status: DC | PRN
Start: 2017-10-03 — End: 2017-10-05

## 2017-10-03 MED ORDER — HYDROCODONE-ACETAMINOPHEN 5-325 MG PO TAB
1 | ORAL | 0 refills | Status: DC | PRN
Start: 2017-10-03 — End: 2017-10-05
  Administered 2017-10-04 – 2017-10-05 (×8): 1 via ORAL

## 2017-10-03 MED ORDER — HYDROCODONE-ACETAMINOPHEN 5-325 MG PO TAB
1 | Freq: Once | ORAL | 0 refills | Status: CP
Start: 2017-10-03 — End: ?
  Administered 2017-10-04: 02:00:00 1 via ORAL

## 2017-10-03 MED ORDER — SENNOSIDES-DOCUSATE SODIUM 8.6-50 MG PO TAB
1 | Freq: Two times a day (BID) | ORAL | 0 refills | Status: DC
Start: 2017-10-03 — End: 2017-10-05

## 2017-10-03 NOTE — ED Notes
PIV attempt x2 unsuccessful. Dr. Osie Bond notified. Katie, RN at bedside to attempt.

## 2017-10-03 NOTE — ED Notes
Patient sleeping comfortably.

## 2017-10-04 LAB — HIV 1& 2 AG-AB SCRN W REFLEX HIV 1 PCR QUANT: Lab: NEGATIVE

## 2017-10-04 LAB — PROTEIN/CR RATIO,UR RAN
Lab: 36 mg/dL
Lab: 40 mg/dL

## 2017-10-04 LAB — SYPHILIS AB SCREEN: Lab: NEGATIVE

## 2017-10-04 LAB — URINALYSIS MICROSCOPIC REFLEX TO CULTURE

## 2017-10-04 LAB — COCAINE-URINE RANDOM: Lab: NEGATIVE

## 2017-10-04 LAB — CANNABINOIDS-URINE RANDOM: Lab: NEGATIVE

## 2017-10-04 LAB — CREATINE KINASE-CPK: Lab: 28 U/L — ABNORMAL LOW (ref 35–232)

## 2017-10-04 LAB — GRAM STAIN

## 2017-10-04 LAB — URINALYSIS DIPSTICK REFLEX TO CULTURE: Lab: NEGATIVE mg/dL (ref 0.3–1.2)

## 2017-10-04 LAB — POC GLUCOSE
Lab: 401 mg/dL — ABNORMAL HIGH (ref 70–100)
Lab: 426 mg/dL — ABNORMAL HIGH (ref 70–100)
Lab: 90 mg/dL (ref 70–100)
Lab: 199 mg/dL — ABNORMAL HIGH (ref 70–100)
Lab: 155 mg/dL — ABNORMAL HIGH (ref 70–100)

## 2017-10-04 LAB — OPIATES-URINE RANDOM: Lab: NEGATIVE

## 2017-10-04 LAB — PHENCYCLIDINES-URINE RANDOM: Lab: NEGATIVE

## 2017-10-04 LAB — HEMOGLOBIN A1C: Lab: 12 % — ABNORMAL HIGH (ref 4.0–6.0)

## 2017-10-04 LAB — SED RATE: Lab: 49 mm/h — ABNORMAL HIGH (ref 0–15)

## 2017-10-04 LAB — OPIATES 300 OR GREATER-URINE RANDOM: Lab: NEGATIVE

## 2017-10-04 LAB — AMPHETAMINES-URINE RANDOM: Lab: NEGATIVE

## 2017-10-04 LAB — CHLAM/NG PCR URINE
Lab: NEGATIVE
Lab: NEGATIVE

## 2017-10-04 LAB — C REACTIVE PROTEIN (CRP): Lab: 0.4 mg/dL (ref <1.0)

## 2017-10-04 LAB — BENZODIAZEPINES-URINE RANDOM: Lab: NEGATIVE

## 2017-10-04 LAB — BARBITURATES-URINE RANDOM: Lab: NEGATIVE

## 2017-10-04 LAB — METHADONE-URINE SCREEN: Lab: NEGATIVE

## 2017-10-04 MED ORDER — VANCOMYCIN PHARMACY TO MANAGE
1 | 0 refills | Status: DC
Start: 2017-10-04 — End: 2017-10-05

## 2017-10-04 MED ORDER — LISINOPRIL 5 MG PO TAB
5 mg | Freq: Every day | ORAL | 0 refills | Status: DC
Start: 2017-10-04 — End: 2017-10-05
  Administered 2017-10-04 – 2017-10-05 (×2): 5 mg via ORAL

## 2017-10-04 MED ORDER — ZOLPIDEM 5 MG PO TAB
5 mg | Freq: Every evening | ORAL | 0 refills | Status: DC | PRN
Start: 2017-10-04 — End: 2017-10-05
  Administered 2017-10-05: 02:00:00 5 mg via ORAL

## 2017-10-04 MED ORDER — SODIUM CHLORIDE 0.9 % IV SOLP
1000 mL | INTRAVENOUS | 0 refills | Status: CP
Start: 2017-10-04 — End: ?
  Administered 2017-10-04: 07:00:00 1000 mL via INTRAVENOUS

## 2017-10-04 MED ORDER — MULTIVIT-IRON-FA-CALCIUM-MINS 9 MG IRON-400 MCG PO TAB
1 | Freq: Every day | ORAL | 0 refills | Status: DC
Start: 2017-10-04 — End: 2017-10-05
  Administered 2017-10-04 – 2017-10-05 (×2): 1 via ORAL

## 2017-10-04 MED ORDER — INSULIN ASPART 100 UNIT/ML SC FLEXPEN
8 [IU] | Freq: Three times a day (TID) | SUBCUTANEOUS | 0 refills | Status: DC
Start: 2017-10-04 — End: 2017-10-05

## 2017-10-04 MED ORDER — VANCOMYCIN 1,500 MG IVPB
15 mg/kg | Freq: Two times a day (BID) | INTRAVENOUS | 0 refills | Status: DC
Start: 2017-10-04 — End: 2017-10-05
  Administered 2017-10-04 – 2017-10-05 (×6): 1500 mg via INTRAVENOUS

## 2017-10-04 MED ORDER — INSULIN GLARGINE 100 UNIT/ML (3 ML) SC INJ PEN
20 [IU] | Freq: Every evening | SUBCUTANEOUS | 0 refills | Status: DC
Start: 2017-10-04 — End: 2017-10-05
  Administered 2017-10-04: 07:00:00 20 [IU] via SUBCUTANEOUS

## 2017-10-04 MED ORDER — INSULIN ASPART 100 UNIT/ML SC FLEXPEN
0-14 [IU] | Freq: Before meals | SUBCUTANEOUS | 0 refills | Status: DC
Start: 2017-10-04 — End: 2017-10-05
  Administered 2017-10-04: 07:00:00 12 [IU] via SUBCUTANEOUS

## 2017-10-04 NOTE — Progress Notes
Pt A&Ox4. Pt ID band verified with patient.  Profile completed, Fall risk in place, care plan/education updated,  call light within reach

## 2017-10-04 NOTE — Progress Notes
Medicine Progress Note    Name:  Bryan Bell   XBJYN'W Date:  10/04/2017  Admission Date: 10/03/2017  LOS: 1 day                     Assessment/Plan:    Active Problems:    Hyperglycemia    Abscess      Bryan Bell is a 36 y.o. 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  ???  Left forearm abscess. Hx of MRSA skin infection s/p I&D on back  - Denies drug use. Denies direct trauma  - ESR 49, CRP 0.42               > wound ctx in process, prior cultures with MRSA susceptible to doxy, bactrim and clinda                > wound team consulted for recommendations on wound care                > Start IV vancomycin (initiated on 10/14 - ) will plan to switch to oral abx tomorrow                > Pain control with oral norco prn with plan to taper and discontinue prior to dc.  ???  RLE edema and pain - doppler without DVT, hip xray without fracture   ???  Reported dysuria, scrotal pain and urinary retention (1000 mL in ED s/p straight cath)  - Patient report history of retracted testicle. He is sexual active  - UA w/o evidence of infection, HIV and syphilis negative. G/C in process (neg on 10/7)  - 09/26/17 US scrotum:  Normal size testicles w/o evidence of torsion or mass  - bladder scan post void to eval for urinary retention, pt at risk of developing neurogenic bladder with history of poorly controlled DM.  ???  **IDDM type 2 - poorly controlled due to non compliance  - Patient states he has not been taking long acting insulin as it bottoms him out  - A1c 12.9               > Consult DM nurse educator               > Obtain A1C               > Pt started on 20 Lantus   ???  ???  HTN - continue PTA lisinopril     ???  Prophylaxis  - VTE:  Lovenox     FEN  - IVF: none   - Diet: diabetic   - Replace electrolytes PRN Code status:  Full Code     Disposition: I personally spent greater than 35 minutes in the care of this patient. More than half of this time was spent with in coordination of the treatment plan as noted above with the assistance of the medical team as well as direct face-to-face patient care and counseling and chart review. Topics discussed include pt's current symptoms, history relating to his abscesses and diabetes management. Plan of care discussed with patient as detailed above.     Princella Pellegrini, MD  Internal Medicine Hospitalist   Personal Pager [phone]  10/04/2017    ________________________________________________________________________  Subjective  Bryan Bell is a 36 y.o. male.  Patient reports pain his his left forearm after I&D. Also has generalized pain and right hip pain after he fell out of his wheelchair a couple of weeks ago. Denies fevers or chills. Currently homeless but states he has a friend that he can stay with if he is able to come up with $100.     Review of Systems: no fevers, chills, N/V abdominal pain constipation or diarrhea     Medications  Scheduled Meds:  diphtheria/tetanus/pertus(acell) booster (Tdap) (BOOSTRIX) injection 0.5 mL 0.5 mL Intramuscular ONCE   insulin aspart U-100 (NOVOLOG FLEXPEN) injection PEN 0-14 Units 0-14 Units Subcutaneous ACHS   insulin aspart U-100 (NOVOLOG FLEXPEN) injection PEN 8 Units 8 Units Subcutaneous TID w/ meals   insulin glargine (LANTUS SOLOSTAR, BASAGLAR) injection PEN 20 Units 20 Units Subcutaneous QHS   lidocaine 1%/EPINEPHrine 1:100,000 injection 20 mL 20 mL Injection ONCE   lisinopril (PRINIVIL; ZESTRIL) tablet 5 mg 5 mg Oral QDAY   senna/docusate (SENOKOT-S) tablet 1 tablet 1 tablet Oral BID   vancomycin (VANCOCIN) 1,500 mg in dextrose 5% (D5W) IVPB 15 mg/kg Intravenous Q12H*   Continuous Infusions:  PRN and Respiratory Meds:HYDROcodone/acetaminophen Q4H PRN, ondansetron (ZOFRAN) IV Q4H PRN, polyethylene glycol 3350 BID PRN, vancomycin   IVPB Q12H* **AND** vancomycin, pharmacy to manage Per Pharmacy      Objective                       Vital Signs: Last Filed                 Vital Signs: 24 Hour Range   BP: 137/81 (10/15 0400)  Temp: 37.2 ???C (99 ???F) (10/15 0400)  Pulse: 88 (10/15 0400)  Respirations: 16 PER MINUTE (10/15 0400)  SpO2: 99 % (10/15 0400)  O2 Delivery: None (Room Air) (10/15 0400)  SpO2 Pulse: 97 (10/14 2120)  Height: 182.9 cm (72) (10/15 0024) BP: (137-146)/(81-92)   Temp:  [36.6 ???C (97.8 ???F)-37.2 ???C (99 ???F)]   Pulse:  [84-88]   Respirations:  [16 PER MINUTE-20 PER MINUTE]   SpO2:  [98 %-100 %]   O2 Delivery: None (Room Air)   Intensity Pain Scale (Self Report): 10 (10/04/17 0024) Vitals:    10/03/17 1631 10/04/17 0024   Weight: 90.7 kg (200 lb) 92.5 kg (203 lb 14.8 oz)       Intake/Output Summary:  (Last 24 hours)    Intake/Output Summary (Last 24 hours) at 10/04/17 0609  Last data filed at 10/03/17 1900   Gross per 24 hour   Intake             1000 ml   Output                0 ml   Net             1000 ml           Physical Exam   CONSTITUTIONAL: Vitals signs reviewed. Well developed, well nourished, resting comfortably and in no distress.    PULM/RESPIRATORY: Lung fields are clear to auscultation bilaterally. There are no wheezes or rales. Normal respiratory effort, not in distress or using accessory muscles to breathe.  CV: There is a regular rhythm. There are no murmurs, gallops or rubs. No edema.   GI/ABDOMEN: The abdomen is soft and non-tender. Normal bowel sounds. There is no palpable hepatosplenomegaly, ascites, or masses.  MUSCULOSKELETAL: s/p LLE BKA  Skin:  L forearm abscess with packing in place and surrounding erythema   NEURO: Cranial nerves are grossly intact. There are no focal motor defects. Moves all extremities.   PSYCH: The patient has a normal mood and affect, with a normal judgment and insight. Recent and remote memory intact. Alert and oriented to person, place, and tim    Lab Review  Pertinent labs results from 10/04/2017 reviewed    Point of Care Testing  (Last 24 hours)  Pertinent POC testing from 10/04/2017 reviewed    Radiology and other Diagnostics Review:    Reviewed     Princella Pellegrini, MD   Pager

## 2017-10-04 NOTE — ED Notes
[id number] @ 2243. Please call Andrey Campanile, RN at [phone].

## 2017-10-04 NOTE — Progress Notes
CLINICAL NUTRITION                                                        Clinical Nutrition Assessment Summary     NAME:Bryan Bell             MRN: [id number]             DOB:October 17, 1981          AGE: 36 y.o.  ADMISSION DATE: 10/03/2017             DAYS ADMITTED: LOS: 1 day    Nutrition Assessment of Patient:  BMI Categories Adult: Over Weight: 25-29.9  Malnutrition Assessment: Does not meet criteria  Current Oral Intake: Adequate  Estimated Calorie Needs: 2315-2700 (30-35 kcal/kg adjusted for BKA)  Estimated Protein Needs: 116-133 (1.4-1.6 g/kg DBW )  Oral Diet Order: Diabetic 2100-2400 Kcal/day (75 g Carb/meal, 30 g Carb/HS snack)    Comments:  36 y.o. 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 Patient was seen today for evaluation of nutritional risk. Patient awake and alert during visit. Reports that his intake prior to admission was variable due to homelessness. Patient notes that he receives food stamps and knows where to obtain meals, however getting to those locations can be difficult. Patient with empty breakfast tray at bedside, 100% intake with multiple protein sources. Patient denies N/V/C/D. He reports not doing anything to manage diabetes due to homelessness. He reports that he has access to medications but has been prescribed insulin in the past and has not been able to follow-through with use. Current HgA1c 12.9, up 0.1% from August reading. No known food allergies to note. Patient denies taking any nutritional supplements prior to admit.     Recommendation:  ??? Continue diet as ordered.   ??? RD to order multivitamin with minerals to aid in wound healing.   ??? Oral supplements available as needed. Intervention / Plan:  Assessment of nutritional status.   Review of importance of blood sugar management + protein for wound healing. Discussed barriers to glucose control and resources to help with those.   Monitor intake, weight, GI function, labs, medications.     Nutrition Diagnosis:  Increased nutrient needs, specify: (protein, micronutrients )  Etiology: increased metabolic demand for wound healing   Signs & Symptoms: presence of DTI      Inability or lack of desire to manage self care  Etiology: diabetes management   Signs & Symptoms: homelessness with knowledge/access to resources     Goals:  Patient to consume >85% of meals  Time Frame: Throughout Stay     Prevent further skin breakdown  Time Frame: Throughout Stay     Aid in stabilizing blood glucose  Time Frame: Throughout Stay    Delice Bison, RD, LD  *[phone]

## 2017-10-04 NOTE — Progress Notes
Pharmacy Vancomycin Note  Subjective:   Bryan Bell is a 36 y.o. male being treated for left forearm abscess.    Objective:     Current Vancomycin Orders   Medication Dose Route Frequency    vancomycin (VANCOCIN) 1,500 mg in dextrose 5% (D5W) IVPB  15 mg/kg Intravenous Q12H*    And    vancomycin, pharmacy to manage  1 each Service Per Pharmacy     Start Date of  Vancomycin therapy: 10/04/2017  Estimated CrCl: 192    Assessment:   Target levels for this patient: trough ~15 mcg/ml.    Plan:   1. Start vancomycin 15 mg/kg q12  2. Next scheduled level(s): prior to the 4th dose  3. Pharmacy will continue to monitor and adjust therapy as needed.    Arrie Senate, [facility]  10/04/2017   Pager [phone]

## 2017-10-04 NOTE — Care Coordination-Inpatient
Admitted by N3. Assigned to Med G.

## 2017-10-04 NOTE — ED Notes
Handoff complete.  Pt resting in cart.  Denies further complaints or concerns at this time.  Pt A&Ox4, resp even NL, VSS.  Bed locked in lowest position, side rails up x 2.

## 2017-10-04 NOTE — Consults
Wound Ostomy Note    NAME:Jaylynn Lenoard Helbert                                                                   MRN: [id number]                 DOB:12/25/1980          AGE: 36 y.o.  ADMISSION DATE: 10/03/2017             DAYS ADMITTED: LOS: 1 day      Reason for Consult/Visit: wound not pressure    Assessment/Plan:    Active Problems:    Hyperglycemia    Abscess      Wounds (NOT for Pressure Injuries) 10/04/17 0030 Left Back Ulcer (not from pressure) (Active)   10/04/17 0030 Back   Wound Orientation: Left   Wound Type: Ulcer (not from pressure)   Wound Type::    Wound Description (Comments):    Wound Base Assessment Pale;Pink;Moist 10/04/2017 11:00 AM   Surrounding Skin Assessment Intact 10/04/2017 11:00 AM   Wound Site Closure Wound Adhesive Bandage 10/04/2017 11:00 AM   Wound Drainage Amount Scant 10/04/2017 11:00 AM   Wound Drainage Description Creamy;Serosanguineous 10/04/2017 11:00 AM   Wound Dressing Status Dressing Reinforced 10/04/2017 11:00 AM   Wound Dressing and / or Treatment A & D Ointment;Foam (Biatain) 10/04/2017 11:00 AM   Wound Length (cm) 1.5 cm 10/04/2017 11:00 AM   Wound Width (cm) 3 cm 10/04/2017 11:00 AM   Wound Depth (cm) 0.2 cm 10/04/2017 11:00 AM   Wound Volume (cm^3) 0.9 cm^3 10/04/2017 11:00 AM   Wound Status (Wound Team Only) Being Treated 10/04/2017 11:00 AM   Number of days: 0       Wounds (NOT for Pressure Injuries) 10/04/17 0030 Left Arm Surgical Incision (Active)   10/04/17 0030 Arm   Wound Orientation: Left   Wound Type: Surgical Incision   Wound Type::    Wound Description (Comments):    Wound Base Assessment Pink;Moist 10/04/2017 11:00 AM   Surrounding Skin Assessment Pink;Induration 10/04/2017 11:00 AM   Wound Site Closure Wound Adhesive Bandage 10/04/2017 11:00 AM   Wound Drainage Amount Scant 10/04/2017 11:00 AM   Wound Drainage Description Serosanguineous 10/04/2017 11:00 AM   Wound Dressing Status Changed 10/04/2017 11:00 AM Wound Dressing and / or Treatment Iodoform Packing Strips 1/4 10/04/2017 11:00 AM   Wound Length (cm) 0.7 cm 10/04/2017 11:00 AM   Wound Width (cm) 0.3 cm 10/04/2017 11:00 AM   Wound Depth (cm) 2.2 cm 10/04/2017 11:00 AM   Wound Volume (cm^3) 0.46 cm^3 10/04/2017 11:00 AM   Wound Status (Wound Team Only) Being Treated 10/04/2017 11:00 AM   Number of days: 0       Wounds (NOT for Pressure Injuries) 10/04/17 1347 Right Back Ulcer (not from pressure) (Active)   10/04/17 1347 Back   Wound Orientation: Right   Wound Type: Ulcer (not from pressure)   Wound Type::    Wound Description (Comments):    Wound Base Assessment Pink;Moist 10/04/2017 11:00 AM   Surrounding Skin Assessment Intact 10/04/2017 11:00 AM   Wound Site Closure Wound Adhesive Bandage 10/04/2017 11:00 AM   Wound Drainage Amount Scant 10/04/2017 11:00 AM   Wound Drainage Description Creamy;Serosanguineous 10/04/2017  11:00 AM   Wound Dressing Status Dressing Reinforced 10/04/2017 11:00 AM   Wound Dressing and / or Treatment A & D Ointment;Foam (Biatain) 10/04/2017 11:00 AM   Wound Length (cm) 0.3 cm 10/04/2017 11:00 AM   Wound Width (cm) 3.5 cm 10/04/2017 11:00 AM   Wound Depth (cm) 0.2 cm 10/04/2017 11:00 AM   Wound Volume (cm^3) 0.21 cm^3 10/04/2017 11:00 AM   Number of days: 0     Pt states he fell out of his wheelchair and presents with multiple wounds in various stages of healing on his RLE, left stump and BUE.  He states that the wounds on his back are from I&D procedures.  The wounds appear to be healing.    RECOMMEND:   LUE: Clean with saline and gauze. Apply 1/4 inch iodoform to wound base leaving 1 inch visible at wound surface. Cover with dry gauze and secure with bandnet. Dressing changes daily per floor RN.  Back wounds: Clean with saline and gauze. Apply Vitamin A&D ointment (requires MD order) to wound base and cover with Biatain foam or gauze and tape. Dressing changes daily per floor RN.  Misc: Apply lotion to BLE/BUE daily per floor RN. Will continue to follow.    --- Primary Team is responsible for placing wound care orders. Please place wound orders ---    [name], RN, BSN, CMSRN, [facility]  Wound Ostomy Nursing Consult Service  Office: [phone]  Pager: [phone]  After Hours Wound/Ostomy Team Pager: [phone]

## 2017-10-05 ENCOUNTER — Encounter: Admit: 2017-10-05 | Discharge: 2017-10-05 | Payer: MEDICAID

## 2017-10-05 ENCOUNTER — Inpatient Hospital Stay: Admit: 2017-10-03 | Discharge: 2017-10-05 | Disposition: A | Discharge status: Home / Self Care

## 2017-10-05 ENCOUNTER — Inpatient Hospital Stay: Admit: 2017-10-04 | Discharge: 2017-10-04 | Payer: MEDICAID

## 2017-10-05 DIAGNOSIS — Z89512 Acquired absence of left leg below knee: ICD-10-CM

## 2017-10-05 DIAGNOSIS — Z9119 Patient's noncompliance with other medical treatment and regimen: ICD-10-CM

## 2017-10-05 DIAGNOSIS — Z8614 Personal history of Methicillin resistant Staphylococcus aureus infection: ICD-10-CM

## 2017-10-05 DIAGNOSIS — Z794 Long term (current) use of insulin: ICD-10-CM

## 2017-10-05 DIAGNOSIS — Z590 Homelessness: ICD-10-CM

## 2017-10-05 DIAGNOSIS — L0390 Cellulitis, unspecified: ICD-10-CM

## 2017-10-05 DIAGNOSIS — B9562 Methicillin resistant Staphylococcus aureus infection as the cause of diseases classified elsewhere: ICD-10-CM

## 2017-10-05 DIAGNOSIS — I10 Essential (primary) hypertension: ICD-10-CM

## 2017-10-05 DIAGNOSIS — L02414 Cutaneous abscess of left upper limb: Principal | ICD-10-CM

## 2017-10-05 DIAGNOSIS — F17200 Nicotine dependence, unspecified, uncomplicated: ICD-10-CM

## 2017-10-05 DIAGNOSIS — E1165 Type 2 diabetes mellitus with hyperglycemia: ICD-10-CM

## 2017-10-05 DIAGNOSIS — N5082 Scrotal pain: ICD-10-CM

## 2017-10-05 LAB — POC GLUCOSE
Lab: 173 mg/dL — ABNORMAL HIGH (ref 70–100)
Lab: 235 mg/dL — ABNORMAL HIGH (ref 70–100)
Lab: 212 mg/dL — ABNORMAL HIGH (ref 70–100)
Lab: 140 mg/dL — ABNORMAL HIGH (ref 70–100)

## 2017-10-05 LAB — CULTURE-WOUND/TISSUE/FLUID(AEROBIC ONLY)W/SENSITIVITY

## 2017-10-05 MED ORDER — VITS A AND D-WHITE PET-LANOLIN TP OINT
TOPICAL | 0 refills | Status: DC | PRN
Start: 2017-10-05 — End: 2017-10-05

## 2017-10-05 MED ORDER — INSULIN GLARGINE 100 UNIT/ML (3 ML) SC INJ PEN
20 [IU] | Freq: Every evening | SUBCUTANEOUS | 3 refills | Status: CN
Start: 2017-10-05 — End: ?

## 2017-10-05 MED ORDER — BLOOD-GLUCOSE METER MISC KIT
PACK | 0 refills | Status: CN
Start: 2017-10-05 — End: ?

## 2017-10-05 MED ORDER — METFORMIN 1,000 MG PO TR24
1000 mg | ORAL_TABLET | Freq: Every day | ORAL | 3 refills | Status: SS
Start: 2017-10-05 — End: 2017-11-03

## 2017-10-05 MED ORDER — SULFAMETHOXAZOLE-TRIMETHOPRIM 800-160 MG PO TAB
1 | Freq: Two times a day (BID) | ORAL | 0 refills | Status: DC
Start: 2017-10-05 — End: 2017-10-05
  Administered 2017-10-05: 14:00:00 1 via ORAL

## 2017-10-05 MED ORDER — BLOOD SUGAR DIAGNOSTIC MISC STRP
1 | ORAL_STRIP | Freq: Before meals | 3 refills | Status: CN
Start: 2017-10-05 — End: ?

## 2017-10-05 MED ORDER — SULFAMETHOXAZOLE-TRIMETHOPRIM 800-160 MG PO TAB
1 | ORAL_TABLET | Freq: Two times a day (BID) | ORAL | 0 refills | Status: AC
Start: 2017-10-05 — End: ?
  Filled 2017-10-05: qty 20, 10d supply

## 2017-10-05 MED ORDER — INSULIN LISPRO 100 UNIT/ML SC SOLN
10 [IU] | Freq: Three times a day (TID) | SUBCUTANEOUS | 1 refills | 30.00000 days | Status: AC
Start: 2017-10-05 — End: 2018-01-17

## 2017-10-05 MED ORDER — LANCETS MISC MISC
1 | Freq: Four times a day (QID) | 11 refills | Status: CN | PRN
Start: 2017-10-05 — End: ?

## 2017-10-05 NOTE — Discharge Instructions
Left Upper Extremity: Clean with saline and gauze. Apply 1/4 inch iodoform to wound base leaving 1 inch visible at wound surface. Cover with dry gauze and secure with bandnet. Dressing changes daily.  Back wounds: Clean with saline and gauze. Apply Vitamin A&D ointment (requires MD order) to wound base and cover with Biatain foam or gauze and tape. Dressing changes daily.  Misc: Apply lotion to Bilateral lower extremities and bilateral upper extremities daily.

## 2017-10-05 NOTE — Discharge Instructions - Pharmacy
Physician Discharge Summary      Name: Bryan Bell  Medical Record Number: [id number]        Account Number:  [id number]  Date Of Birth:  04-14-1981                         Age:  36 years   Admit date:  10/03/2017                     Discharge date:  10/05/2017    Attending Physician:  Ovidio Hanger               Service: Med Private G- [phone]    Physician Summary completed by: Princella Pellegrini, MD    Reason for hospitalization: MRSA abscess and cellulitis     Significant PMH:   Past Medical History:   Diagnosis Date   ??? COPD (chronic obstructive pulmonary disease) (HCC)    ??? 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 Hyperlipidemia    ??? Hypertension    ??? Psychiatric illness     bipolar, depression, anxiety,           Allergies: Patient has no known allergies.    Admission Physical Exam notable for:     General:  Alert and response to question appropriately.  VS as above. No acute distress .     Eyes: No conjunctival injection.     No scleral icterus.    Ears, nose, mouth, and throat : No erythema.   MMM.   No erythema, exudate noted.    Neck:  Symmetric appearance without crepitus, no obvious mass or noticeable swelling,   Lungs:  No use of accessory muscles.  Clear to auscultation bilaterally without wheezes, rales, or rhonchi.   Cardiovascular: Regular rate, S1, S2 normal, no murmurs, clicks, rubs, or gallops appreciated .  2+ and symmetric, all extremities. RLE edema   Abdomen:  BS+, soft, no guarding or rigidity.  Nontender to palpation without palpable masses.  No rebound tenderness.   Skin: multiple skin excortiation and scars  Musculoskeletal:  Normal 5/5  hand-grip strength and  distal strength in BLE.  ROM normal. No joints swelling or erythema. s/p left BKA. Right RLE edema. Hip - non ttp. Patient able to move  Right fore arm: at least 4 cm wound w/ ttp.  Neurologic:  Alerted and oriented .  5/5 hand-grip and distal strenght. Normal ROM.  No  Sensation grossly intact. Psych:  Alerted and oriented, calm, normal affect    Admission Lab/Radiology studies notable for:   Glucose 426    Brief Hospital Course:  The patient was admitted and the following issues were addressed during this hospitalization: (with pertinent details).       Bryan Bell is a 36 y.o. 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   ???  ???  Left forearm abscess. Hx of MRSA skin infection s/p I&D on back  - Denies drug use. Denies direct trauma  - ESR 49, CRP 0.42  - wound ctx with MRSA susceptible to doxy, bactrim and clinda. Pt to be d/c'd on a 10 day course of Bactrim. Wound team consulted and recommended pt pack wound. He was given supplies to do this.  ???  RLE edema and pain - doppler without DVT, hip xray without fracture   ???  **IDDM type 2???- poorly controlled due to non compliance  - Patient states he has not been taking long acting insulin as it bottoms him out. He states that he can get insulin through [facility]. I recommended that he follow up there as soon as possible as he was hyperglycemic on admit. We also started metformin    Condition at Discharge: Stable    Discharge Diagnoses:       Hospital Problems        Active Problems    Hyperglycemia    Abscess          Surgical Procedures: None    Significant Diagnostic Studies and Procedures: noted in brief hospital course    Consults:  None    Patient Disposition: Home       Patient instructions/medications:     Activity as Tolerated   It is important to keep increasing your activity level after you leave the hospital.  Moving around can help prevent blood clots, lung infection (pneumonia) and other problems.  Gradually increasing the number of times you are up moving around will help you return to your normal activity level more quickly.  Continue to increase the number of times you are up to the chair and walking daily to return to your normal activity level. Begin to work toward your normal activity level at discharge     Report These Signs and Symptoms   Please contact your doctor if you have any of the following symptoms: temperature higher than 100 degrees F, uncontrolled pain or persistent nausea and/or vomiting     Questions About Your Stay   For questions or concerns regarding your hospital stay. Call [phone]   Discharging attending physician: Princella Pellegrini [[id number]]      Diabetic Diet   You should eat between 2100 and 2400 calories per day.  This is equal to 75g (grams) of carbohydrates per meal, and 30g of carbohydrates for a bedtime snack.    If you have questions about your diet after you go home, you can call a dietitian at [phone].     Additional Discharge Instructions   Please follow up with [facility] to obtain insulin. You should take long acting (such as lantus) 20 units per day.        Current Discharge Medication List       START taking these medications    Details   metFORMIN-ER(+) (FORTAMET) 1,000 mg extended release tablet Take one tablet by mouth daily with dinner.  Qty: 90 tablet, Refills: 3    PRESCRIPTION TYPE:  Normal      trimethoprim/sulfamethoxazole (BACTRIM DS) 160/800 mg tablet Take one tablet by mouth twice daily for 10 days.  Qty: 20 tablet, Refills: 0    PRESCRIPTION TYPE:  Normal          CONTINUE these medications which have been CHANGED or REFILLED    Details   insulin lispro(+) (HUMALOG U-100 INSULIN) 100 unit/mL injection Inject ten Units under the skin three times daily before meals.  Qty: 10 vial, Refills: 1    PRESCRIPTION TYPE:  Normal          CONTINUE these medications which have NOT CHANGED    Details   blood sugar diagnostic (GLUCOCARD EXPRESSION) test strip Use one strip as directed before meals and at bedtime.  Qty: 300 strip, Refills: 3 PRESCRIPTION TYPE:  Normal  Blood-Glucose Meter (GLUCOCARD EXPRESSION) kit Use as directed  Qty: 1 kit, Refills: 0    PRESCRIPTION TYPE:  Normal      insulin pen needles (disposable) (BD UF NANO PEN NEEDLES) 32 gauge x 5/32 pen needle Use one each as directed as Needed. Use with insulin injections.  Qty: 300 each, Refills: 3    PRESCRIPTION TYPE:  Normal      lancets 30 guage 30 gauge Use 1 each as directed four times daily as needed.  Qty: 300 each, Refills: 11    PRESCRIPTION TYPE:  Normal      lisinopril (PRINIVIL; ZESTRIL) 5 mg tablet Take one tablet by mouth daily.  Qty: 90 tablet, Refills: 0    PRESCRIPTION TYPE:  Normal          The following medications were removed from your list. This list includes medications discontinued this stay and those removed from your prior med list in our system        acetaminophen (TYLENOL) 500 mg tablet        chlorhexidine (HIBICLENS) 4 % topical liquid        insulin aspart U-100 (NOVOLOG FLEXPEN) 100 unit/mL injection PEN        insulin glargine (LANTUS SOLOSTAR, BASAGLAR) 100 unit/mL (3 mL) injection PEN        vitamin A and D oint               Scheduled appointments:     Resources for the [facility]    [facility] Health Resource: Find full listing of clinics at kchealthresource.org, which has interactive maps and information on low cost clinics, behavioral health, dental, prescription assistance and local health departments.    Other Medical Services    [facility]: [email or web address]  Multiple locations, provides emergency dental care, diabetic supplies, durable medical equipment, prescription assistance, vision care and eyeglasses    [facility]:  [email or web address] [address] [facility] [place],  provides diabetes education, chronic disease management, prescription assistance.    Donated specialty care and radiology services for the uninsured with low incomes:  [facility] and [facility] programs. [email or web address],  [phone] or [employer]Cele[employer].com.pt [phone].  Referrals to this program must be made through a primary care physician or safety net clinic.    Health Navigation:  [employer] -  HugeLawyers.dk     Service providers:  linkforcare.org has information on transportation, healthcare services, food and clothing, caregiver resources, personal services, housing options and services, finance and legal services    Prescription assistance    In addition to Medicine [address] and [address], many drug companies have prescription assistance programs. Websites such as  needymeds.org, and pparx.org have more information.  Applications are found on drug company websites.  Also: [place]drugcard.com, goodRx.com have discount cards.    Dental    See [facility] health resource guide listed above.  [employer] dental school.                    Pending items needing follow up: none     Signed:  Princella Pellegrini, MD  10/05/2017      cc:  Primary Care Physician:  Lonell Face   Verified  Referring physicians:  No ref. provider found   Additional provider(s):

## 2017-10-05 NOTE — Case Management (ED)
CMA Note:    Received request from [facility] to deliver clothing and bus pass to pt, Clothing and bus pass delivered to pt.     Merryl Hacker  Case Management Assistant  For additional assistance please contact SWCM Lianne Cure  [phone]

## 2017-10-05 NOTE — Progress Notes
Bryan Bell discharged on 10/05/2017.   Marland Kitchen  Discharge instructions reviewed with patient.  Valuables returned:   Personal Items / Valuables: Clothing.    Functional assessment at discharge complete: Yes      Pt's IV removed intact. Pt left the unit with no signs or symptoms of clinical distress. Pt transported self off unit via wheelchair.

## 2017-10-05 NOTE — Progress Notes
Day of Discharge Progress Note    S:   Pt w/o acute events overnight.     O:   Exam unchanged from day prior.    Vitals:    10/04/17 1950 10/04/17 2340 10/05/17 0520 10/05/17 0819   BP: 136/80 140/76 127/72 133/73   Pulse: 80 85 81 77   Temp: 36.6 C (97.9 F) 36.9 C (98.4 F) 36.8 C (98.2 F) 36.8 C (98.3 F)   SpO2: 98% 98% 98% 99%   Weight:       Height:         Labs reviewed.    A/P:    Pt stable for DC today. Will discharge with a prescription for 10 days of Bactrim as well as supplies for wound changes. He states that he can get insulin through [facility]. I recommended that he follow up there as soon as possible as he was hyperglycemic on admit. We also started metformin     I personally spent greater than 35 minutes in the discharge care of this patient. This time was spent with in coordination of the treatment plan with the assistance of the NCM, SW team and the nursing staff. Direct face-to-face patient care and counseling was provided. Topics discussed include instructions for outpatient management and recommended follow up, review of the medication list and any changes that were made during this hospitalization, and concerning signs and symptoms that should prompt the patient to seek medical care urgently.     Staff name:  Princella Pellegrini, MD Date:  10/05/2017   Pager:  [phone]    No future appointments.

## 2017-10-05 NOTE — Discharge Instructions - Appointments
Resources for the Uninsured    Safety Net Clinics    [place] Health Resource: Find full listing of clinics at [place]althresource.org, which has interactive maps and information on low cost clinics, behavioral health, dental, prescription assistance and local health departments.    Other Medical Services    [facility]: [facility]org  Multiple locations, provides emergency dental care, diabetic supplies, durable medical equipment, prescription assistance, vision care and eyeglasses    [facility]:  [facility] [address] [place],  provides diabetes education, chronic disease management, prescription assistance.    Donated specialty care and radiology services for the uninsured with low incomes:  [facility] [facility] and [facility] programs. [facility].org,  913-403-1423 or metrocare[place].org 816-304-2699.  Referrals to this program must be made through a primary care physician or safety net clinic.    Health Navigation:  [place] -  elcent[facility]inc.com     Service providers:  linkforcare.org has information on transportation, healthcare services, food and clothing, caregiver resources, personal services, housing options and services, finance and legal services    Prescription assistance    In addition to [facility] and [facility], many drug companies have prescription assistance programs. Websites such as  needymeds.org, and pparx.org have more information.  Applications are found on drug company websites.  Also: [place]drugcard.com, goodRx.com have discount cards.    Dental    See [place] health resource guide listed above.  [employer] dental school.

## 2017-10-08 LAB — CULTURE-ANAEROBIC

## 2017-10-14 ENCOUNTER — Encounter: Admit: 2017-10-14 | Discharge: 2017-10-14 | Payer: MEDICAID

## 2017-11-02 ENCOUNTER — Encounter: Admit: 2017-11-02 | Discharge: 2017-11-02 | Payer: MEDICAID

## 2017-11-02 DIAGNOSIS — I10 Essential (primary) hypertension: ICD-10-CM

## 2017-11-02 DIAGNOSIS — T148XXA Other injury of unspecified body region, initial encounter: ICD-10-CM

## 2017-11-02 DIAGNOSIS — E119 Type 2 diabetes mellitus without complications: Principal | ICD-10-CM

## 2017-11-02 DIAGNOSIS — E785 Hyperlipidemia, unspecified: ICD-10-CM

## 2017-11-02 DIAGNOSIS — J449 Chronic obstructive pulmonary disease, unspecified: ICD-10-CM

## 2017-11-02 DIAGNOSIS — F99 Mental disorder, not otherwise specified: ICD-10-CM

## 2017-11-02 MED ORDER — VANCOMYCIN PHARMACY TO MANAGE
1 | 0 refills | Status: DC
Start: 2017-11-02 — End: 2017-11-03

## 2017-11-02 MED ORDER — VANCOMYCIN 2,000 MG IVPB
2 g | Freq: Once | INTRAVENOUS | 0 refills | Status: CP
Start: 2017-11-02 — End: ?
  Administered 2017-11-03 (×2): 2 g via INTRAVENOUS

## 2017-11-02 MED ORDER — FENTANYL CITRATE (PF) 50 MCG/ML IJ SOLN
25-50 ug | Freq: Once | INTRAVENOUS | 0 refills | Status: CP
Start: 2017-11-02 — End: ?
  Administered 2017-11-03: 01:00:00 50 ug via INTRAVENOUS

## 2017-11-02 MED ORDER — SODIUM CHLORIDE 0.9 % IV SOLP
INTRAVENOUS | 0 refills | Status: AC
Start: 2017-11-02 — End: ?
  Administered 2017-11-03 – 2017-11-04 (×5): 1000.000 mL via INTRAVENOUS

## 2017-11-02 MED ORDER — HYDROCODONE-ACETAMINOPHEN 5-325 MG PO TAB
1 | Freq: Once | ORAL | 0 refills | Status: CP
Start: 2017-11-02 — End: ?
  Administered 2017-11-03: 03:00:00 1 via ORAL

## 2017-11-02 MED ORDER — INSULIN ASPART 100 UNIT/ML SC FLEXPEN
5 [IU] | Freq: Once | SUBCUTANEOUS | 0 refills | Status: CP
Start: 2017-11-02 — End: ?
  Administered 2017-11-03: 04:00:00 5 [IU] via SUBCUTANEOUS

## 2017-11-02 MED ORDER — VANCOMYCIN 1,500 MG IVPB
15 mg/kg | Freq: Once | INTRAVENOUS | 0 refills | Status: DC
Start: 2017-11-02 — End: 2017-11-03

## 2017-11-02 MED ORDER — PIPERACILLIN-TAZOBACTAM-DEXTRS 2.25 GRAM/50 ML IV PGBK
2.25 g | Freq: Once | INTRAVENOUS | 0 refills | Status: DC
Start: 2017-11-02 — End: 2017-11-03

## 2017-11-02 MED ORDER — ENOXAPARIN 40 MG/0.4 ML SC SYRG
40 mg | Freq: Every day | SUBCUTANEOUS | 0 refills | Status: DC
Start: 2017-11-02 — End: 2017-11-20
  Administered 2017-11-03 – 2017-11-19 (×15): 40 mg via SUBCUTANEOUS

## 2017-11-02 MED ORDER — INSULIN ASPART 100 UNIT/ML SC FLEXPEN
0-7 [IU] | Freq: Before meals | SUBCUTANEOUS | 0 refills | Status: DC
Start: 2017-11-02 — End: 2017-11-03

## 2017-11-02 MED ORDER — ONDANSETRON HCL (PF) 4 MG/2 ML IJ SOLN
4 mg | INTRAVENOUS | 0 refills | Status: DC | PRN
Start: 2017-11-02 — End: 2017-11-20
  Administered 2017-11-04 – 2017-11-19 (×19): 4 mg via INTRAVENOUS

## 2017-11-02 MED ORDER — LACTATED RINGERS IV SOLP
1000 mL | INTRAVENOUS | 0 refills | Status: CP
Start: 2017-11-02 — End: ?
  Administered 2017-11-03: 01:00:00 1000 mL via INTRAVENOUS

## 2017-11-02 MED ORDER — PIPERACILLIN-TAZOBACTAM-DEXTRS 3.375 GRAM/50 ML IV PGBK
3.375 g | Freq: Once | INTRAVENOUS | 0 refills | Status: CP
Start: 2017-11-02 — End: ?
  Administered 2017-11-03: 02:00:00 3.375 g via INTRAVENOUS

## 2017-11-02 MED ORDER — HYDROCODONE-ACETAMINOPHEN 5-325 MG PO TAB
1 | ORAL | 0 refills | Status: DC | PRN
Start: 2017-11-02 — End: 2017-11-03

## 2017-11-02 MED ORDER — ACETAMINOPHEN 325 MG PO TAB
650 mg | ORAL | 0 refills | Status: DC | PRN
Start: 2017-11-02 — End: 2017-11-20
  Administered 2017-11-03 – 2017-11-13 (×11): 650 mg via ORAL

## 2017-11-02 MED ORDER — SODIUM CHLORIDE 0.9 % IV SOLP
1000 mL | INTRAVENOUS | 0 refills | Status: DC
Start: 2017-11-02 — End: 2017-11-03

## 2017-11-02 MED ORDER — SODIUM CHLORIDE 0.9 % IV SOLP
1000 mL | INTRAVENOUS | 0 refills | Status: CP
Start: 2017-11-02 — End: ?
  Administered 2017-11-03: 03:00:00 1000 mL via INTRAVENOUS

## 2017-11-03 ENCOUNTER — Encounter: Admit: 2017-11-03 | Discharge: 2017-11-03 | Payer: MEDICAID

## 2017-11-03 DIAGNOSIS — T148XXA Other injury of unspecified body region, initial encounter: Principal | ICD-10-CM

## 2017-11-03 LAB — POC GLUCOSE
Lab: 334 mg/dL — ABNORMAL HIGH (ref >60)
Lab: 401 mg/dL — ABNORMAL HIGH (ref 70–100)
Lab: 278 mg/dL — ABNORMAL HIGH (ref 70–100)
Lab: 211 mg/dL — ABNORMAL HIGH (ref 70–100)
Lab: 221 mg/dL — ABNORMAL HIGH (ref 70–100)

## 2017-11-03 LAB — URINALYSIS DIPSTICK
Lab: NEGATIVE
Lab: NEGATIVE
Lab: NEGATIVE
Lab: NEGATIVE
Lab: NEGATIVE

## 2017-11-03 LAB — GRAM STAIN

## 2017-11-03 LAB — COMPREHENSIVE METABOLIC PANEL
Lab: 0.3 mg/dL (ref 0.3–1.2)
Lab: 0.6 mg/dL (ref 0.4–1.24)
Lab: 11 U/L (ref 7–56)
Lab: 114 U/L — ABNORMAL HIGH (ref 25–110)
Lab: 13 U/L (ref 7–40)
Lab: 134 MMOL/L — ABNORMAL LOW (ref >60)
Lab: 24 MMOL/L (ref 21–30)
Lab: 3.6 g/dL (ref 3.5–5.0)
Lab: 4.1 MMOL/L — ABNORMAL LOW (ref >60)
Lab: 522 mg/dL — ABNORMAL HIGH (ref 70–100)
Lab: 7.5 g/dL (ref 6.0–8.0)
Lab: 9.7 mg/dL (ref 8.5–10.6)
Lab: 99 MMOL/L (ref 98–110)
Lab: 136 MMOL/L — ABNORMAL LOW (ref 137–147)

## 2017-11-03 LAB — POC LACTATE
Lab: 3.5 MMOL/L — ABNORMAL HIGH (ref 0.5–2.0)
Lab: 1.2 MMOL/L (ref 0.5–2.0)

## 2017-11-03 LAB — PROTIME INR (PT): Lab: 1 M/UL — ABNORMAL LOW (ref 0.8–1.2)

## 2017-11-03 LAB — PHOSPHORUS: Lab: 3.7 mg/dL — ABNORMAL LOW (ref 2.0–4.5)

## 2017-11-03 LAB — URINALYSIS, MICROSCOPIC

## 2017-11-03 LAB — TSH WITH FREE T4 REFLEX: Lab: 1.6 uU/mL — ABNORMAL HIGH (ref >60)

## 2017-11-03 LAB — CBC AND DIFF
Lab: 6.3 K/UL — ABNORMAL LOW (ref 4.5–11.0)
Lab: 5.9 K/UL — ABNORMAL LOW (ref >60)

## 2017-11-03 LAB — SED RATE: Lab: 73 mm/h — ABNORMAL HIGH (ref 0–15)

## 2017-11-03 LAB — C REACTIVE PROTEIN (CRP): Lab: 11 mg/dL — ABNORMAL HIGH (ref <1.0)

## 2017-11-03 LAB — MAGNESIUM: Lab: 1.7 mg/dL — ABNORMAL LOW (ref >60)

## 2017-11-03 MED ORDER — VANCOMYCIN 1,500 MG IVPB
15 mg/kg | Freq: Two times a day (BID) | INTRAVENOUS | 0 refills | Status: DC
Start: 2017-11-03 — End: 2017-11-04
  Administered 2017-11-03 – 2017-11-04 (×5): 1500 mg via INTRAVENOUS

## 2017-11-03 MED ORDER — VANCOMYCIN PHARMACY TO MANAGE
1 | 0 refills | Status: DC
Start: 2017-11-03 — End: 2017-11-04

## 2017-11-03 MED ORDER — POTASSIUM CHLORIDE 20 MEQ PO TBTQ
40 meq | Freq: Once | ORAL | 0 refills | Status: CP
Start: 2017-11-03 — End: ?
  Administered 2017-11-03: 15:00:00 40 meq via ORAL

## 2017-11-03 MED ORDER — PIPERACILLIN-TAZOBACTAM-DEXTRS 3.375 GRAM/50 ML IV PGBK
3.375 g | INTRAVENOUS | 0 refills | Status: DC
Start: 2017-11-03 — End: 2017-11-05
  Administered 2017-11-03 – 2017-11-05 (×9): 3.375 g via INTRAVENOUS

## 2017-11-03 MED ORDER — INSULIN ASPART 100 UNIT/ML SC FLEXPEN
15 [IU] | Freq: Three times a day (TID) | SUBCUTANEOUS | 0 refills | Status: DC
Start: 2017-11-03 — End: 2017-11-07

## 2017-11-03 MED ORDER — INSULIN ASPART 100 UNIT/ML SC FLEXPEN
10 [IU] | Freq: Three times a day (TID) | SUBCUTANEOUS | 0 refills | Status: DC
Start: 2017-11-03 — End: 2017-11-03

## 2017-11-03 MED ORDER — TRAZODONE 50 MG PO TAB
50 mg | Freq: Every evening | ORAL | 0 refills | Status: DC | PRN
Start: 2017-11-03 — End: 2017-11-20
  Administered 2017-11-03 – 2017-11-19 (×8): 50 mg via ORAL

## 2017-11-03 MED ORDER — HYDROCODONE-ACETAMINOPHEN 5-325 MG PO TAB
1 | ORAL | 0 refills | Status: DC | PRN
Start: 2017-11-03 — End: 2017-11-09
  Administered 2017-11-03 – 2017-11-09 (×23): 1 via ORAL

## 2017-11-03 MED ORDER — INSULIN ASPART 100 UNIT/ML SC FLEXPEN
0-7 [IU] | Freq: Before meals | SUBCUTANEOUS | 0 refills | Status: DC
Start: 2017-11-03 — End: 2017-11-17
  Administered 2017-11-03: 07:00:00 4 [IU] via SUBCUTANEOUS
  Administered 2017-11-14: 3 [IU] via SUBCUTANEOUS

## 2017-11-03 MED ORDER — INSULIN GLARGINE 100 UNIT/ML (3 ML) SC INJ PEN
30 [IU] | Freq: Every evening | SUBCUTANEOUS | 0 refills | Status: DC
Start: 2017-11-03 — End: 2017-11-04
  Administered 2017-11-04: 03:00:00 30 [IU] via SUBCUTANEOUS

## 2017-11-03 MED ORDER — MUPIROCIN 2 % TP OINT
Freq: Two times a day (BID) | TOPICAL | 0 refills | Status: AC
Start: 2017-11-03 — End: ?

## 2017-11-03 NOTE — Progress Notes
 Home Infusion:    Zosyn 3.375g Q6h  Vanco 1500mg  Q12h    Received new referral from NCM Zollie BeckersKurt Reed.    Patient is self pay due to no commercial insurance.  Self pay pricing was ran and patient will be responsible for $943.37 weekly for medication and supplies.    NCM asked about financial application and I emailed it to him at [email or web address]    Home Health: TBD

## 2017-11-03 NOTE — Consults
IMPRESSION:  1. Recurrent MRSA abscess. 11/02/17 with lateral right lower leg abscess.   2. psychosocioeconomic circumstance  3. Diabetes mellitus type 1  4. H/o left BKA  5. Pruritis and body scratching  6. anemia          PLAN:  1. Cont mupirocin to nares  2. Use CHG bathing  3. F/u blood cultures  4. Cont current abx while awaiting wound culture.   5. Monitor for drug adverse reaction/toxicity  6. Await ortho opinion  7. Refuses influenza vaccine.   8. Will follow    PLEASE SEE FULL DICTATED CONSULT #[id number]

## 2017-11-03 NOTE — ED Notes
HC5-CVP15 @ 2209. Please call Colton, RN at [phone].

## 2017-11-03 NOTE — ED Notes
Pt given sack lunch

## 2017-11-03 NOTE — ED Notes
HC5-CVp20 @ 2212. Please call Colton, RN at [phone].

## 2017-11-03 NOTE — Progress Notes
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    RLE wound  - ESR = 73  - MRI pending  - ortho consulted   - ID consulted  - no ostial destruction on the plain films   - cont Vanc, zosyn  - cont current pain control, but will need to be titrated prior to discharge d/t lack of outside prescriber   - wound cx ordered  - 1/4 SIRS w/ tachycardia only  - blood cx NGTD    H/o multiple MRSA wounds  - cont CHG baths, mupirocin to nares    Uncontrolled DM2  - compliance c/b lack of insurance  - increase aspart to 15u TID AC  - resume lantus 30u QHS  - [facility]  - diabetes RN consulted    FEN: no IVF, replace lytes PRN, ADA diet  Ppx: lovenox  Code: Full    Dispo: cont inpt admit, cont current abx.  MRI ordered.   ________________________________________________________________________    Subjective  Bryan Bell is a 36 y.o. male.  Patient reports ongoing RLE pain.  Reports first noticing drainage and pain in his RLE 2 weeks ago when he was staying in a hotel with his friend.  Denies fevers.  Reports following at [facility] and is taking 40u insulin with meals.      ROS: neg for fevers, chills, dyspnea, chest pain, palpitations     Medications  Scheduled Meds:  enoxaparin (LOVENOX) syringe 40 mg 40 mg Subcutaneous QDAY(21) insulin aspart U-100 (NOVOLOG FLEXPEN) injection PEN 0-7 Units 0-7 Units Subcutaneous ACHS   insulin aspart U-100 (NOVOLOG FLEXPEN) injection PEN 15 Units 15 Units Subcutaneous TID w/ meals   mupirocin (BACTROBAN) 2 % topical ointment  Topical BID   piperacillin/tazobactam  (ZOSYN) 3.375 g/50 mL iso-osmotic IVPB 3.375 g Intravenous Q6H*   vancomycin (VANCOCIN) 1,500 mg in dextrose 5% (D5W) IVPB 15 mg/kg Intravenous Q12H*   Continuous Infusions:  ??? sodium chloride 0.9 %   infusion 125 mL/hr at 11/03/17 0231     PRN and Respiratory Meds:acetaminophen Q6H PRN, HYDROcodone/acetaminophen Q4H PRN, ondansetron (ZOFRAN) IV Q6H PRN, traZODone QHS PRN, vancomycin   IVPB Q12H* **AND** vancomycin, pharmacy to manage Per Pharmacy      Objective                       Vital Signs: Last Filed                 Vital Signs: 24 Hour Range   BP: 120/65 (11/14 0734)  Temp: 36.8 ???C (98.2 ???F) (11/14 0734)  Pulse: 88 (11/14 0734)  Respirations: 16 PER MINUTE (11/14 0734)  SpO2: 98 % (11/14  1610)  O2 Delivery: None (Room Air) (11/14 0734)  SpO2 Pulse: 112 (11/13 2230)  Height: 182.9 cm (72) (11/14 0000) BP: (120-156)/(65-102)   Temp:  [36.3 ???C (97.3 ???F)-36.9 ???C (98.5 ???F)]   Pulse:  [88-113]   Respirations:  [9 PER MINUTE-27 PER MINUTE]   SpO2:  [97 %-100 %]   O2 Delivery: None (Room Air)   Intensity Pain Scale (Self Report): 10 (11/03/17 0820) Vitals:    11/02/17 1743 11/03/17 0000   Weight: 99.8 kg (220 lb) 90.5 kg (199 lb 9.6 oz)       Intake/Output Summary:  (Last 24 hours)    Intake/Output Summary (Last 24 hours) at 11/03/17 0924  Last data filed at 11/03/17 0820   Gross per 24 hour   Intake             1080 ml   Output             2650 ml   Net            -1570 ml           Physical Exam  General:  Alert, cooperative, no distress, appears stated age.  Head:  Normocephalic, without obvious abnormality, atraumatic.  Eyes:  Conjunctivae/corneas clear.    Lungs:  CTAB, no rales, no wheezing.  Heart:  Tachy, reg rhythm, no MRG. Abdomen:  Soft, non-tender.  Bowel sounds normal.    Extremities:  S/p L BKA, RLE w/ eschar and ~2.5x2.5cm open wound on the lateral aspect of the mid leg  Pulses:   2+ radial pulses b/l  Skin:   Multiple prior scars on his back.  ~1cm open lesion under the left axillae, improved from my prior examinations of him.      Lab Review  Hematology:  Lab Results   Component Value Date    HGB 9.8 11/03/2017    HCT 29.1 11/03/2017    PLTCT 216 11/03/2017    WBC 5.9 11/03/2017    NEUT 49 11/03/2017    ANC 2.90 11/03/2017    ALC 2.40 11/03/2017    MONA 9 11/03/2017    AMC 0.50 11/03/2017    ABC 0.00 11/03/2017    MCV 82.6 11/03/2017    MCHC 33.5 11/03/2017    MPV 7.7 11/03/2017    RDW 14.9 11/03/2017   , Coagulation:    Lab Results   Component Value Date    PTT 29.6 08/26/2017    INR 1.0 11/03/2017   , General Chemistry:  Lab Results   Component Value Date    NA 136 11/03/2017    K 3.5 11/03/2017    CL 104 11/03/2017    GAP 7 11/03/2017    BUN 7 11/03/2017    CR 0.61 11/03/2017    GLU 432 11/03/2017    CA 8.5 11/03/2017    ALBUMIN 2.8 11/03/2017    LACTIC 1.9 08/04/2017    MG 1.7 11/03/2017    TOTBILI 0.2 11/03/2017    and Enzymes:  Lab Results   Component Value Date    AST 9 11/03/2017    ALT 9 11/03/2017    ALKPHOS 86 11/03/2017       Point of Care Testing  (Last 24 hours)  Glucose: (!) 432 (11/03/17 0447)  POC Glucose (Download): (!) 401 (11/03/17 0754)    Radiology and other Diagnostics Review:    Pertinent radiology reviewed.    Georgiana Shore, MD   Pager [phone]

## 2017-11-03 NOTE — Consults
[facility] Consult Note      Consult Date: 11/02/2017                                                  Chief Complaint/Reason for Consult: Right lower extremity wound with concern for deep tissue infection.    Assessment/Plan   1.  Right lower extremity wound with surrounding erythema and purulent drainage.  There does not seem to be involvement of the knee joint.    Based on clinical exam and initial imaging, he has a right lower extremity wound with surrounding erythema and purulent drainage.  This wound does not seem to track deep and does not seem to involve the joint.  He has no tenderness to palpation along the joint line and has full active and passive range of motion of the right knee joint.  Recommend continued medical optimization as well as antibiotic coverage.  Will follow up the results of the MRI of the right lower extremity for further evaluation of the need for surgical intervention.    Orthopedic Intervention -no acute surgical intervention  WB Status -weightbearing as tolerated right lower extremity  Antibiotics / Tetanus -infectious disease on board.  Appreciate their recommendations  Pain Control per primary  Diet diabetic diet  PT/OT  DVT PPX - Mechanical, Chemoprophylaxis per primary    Dispo -will likely remain inpatient for medical optimization and antibiotic treatment.  Will evaluate MRI for future surgical intervention which could occur later this week if indicated.  ______________________________________________________________________    History of Present Illness: Bryan Bell is a 36 y.o. male with past medical history of uncontrolled diabetes, COPD, hyperlipidemia, hypertension, depression, anxiety, multiple abscesses, MRSA, and sleep apnea who presented to the emergency department on 11/02/2017 with right lower extremity pain and wound with hematogenous and purulent drainage. He uses a wheelchair in his day-to-day life due to a history of a left below-knee amputation.  He has uncontrolled diabetes with a low his latest hemoglobin A1c in October 2018 of 12.9. He first noticed the right leg wound 2-3 weeks ago and it has been worsening since that time over the past few days.  He has noticed erythema around the wound and it is been draining blood and purulent material.  He denies any fevers or chills.  He cannot bear weight on his right lower extremity without the onset of pain.  He has a history of multiple skin abscesses due to his diabetes and to the fact that he picks at his skin as a symptom of his anxiety.    Past Medical History:   Diagnosis Date   ??? COPD (chronic obstructive pulmonary disease) (HCC)    ??? 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 Hyperlipidemia    ??? Hypertension    ??? Psychiatric illness     bipolar, depression, anxiety,        Past Surgical History:   Procedure Laterality Date   ??? BELOW KNEE AMPUTATION Left        Social History  Current tobacco use.  One pack per day.  18-pack-year history.  Denies current illicit drug use.  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  Rare alcohol use    Family history  Family history positive for coronary artery disease, stroke, diabetes, hypertension, hyperlipidemia,  and cancer.   Family history is otherwise noncontributory for presenting problem    Allergies:  Patient has no known allergies.    Outpatient Prescriptions as of 11/03/2017   Medication Sig Dispense Refill   ??? atorvastatin (LIPITOR) 20 mg tablet Take 20 mg by mouth daily.     ??? blood sugar diagnostic (GLUCOCARD EXPRESSION) test strip Use one strip as directed before meals and at bedtime. 300 strip 3   ??? Blood-Glucose Meter (GLUCOCARD EXPRESSION) kit Use as directed 1 kit 0   ??? buPROPion (WELLBUTRIN) 100 mg tablet Take 150 mg by mouth daily.     ??? buPROPion XL (WELLBUTRIN XL) 150 mg tablet Take 150 mg by mouth every morning. Do not crush or chew.     ??? insulin glargine (LANTUS SOLOSTAR, BASAGLAR) 100 unit/mL (3 mL) injection PEN Inject 90 Units under the skin at bedtime daily.     ??? insulin lispro(+) (HUMALOG U-100 INSULIN) 100 unit/mL injection Inject 10 units under the skin three times daily before meals. (Patient taking differently: Inject 30-40 Units under the skin three times daily before meals.) 10 vial 1   ??? insulin pen needles (disposable) (BD UF NANO PEN NEEDLES) 32 gauge x 5/32 pen needle Use one each as directed as Needed. Use with insulin injections. 300 each 3   ??? lancets 30 guage 30 gauge Use 1 each as directed four times daily as needed. 300 each 11   ??? lisinopril (PRINIVIL; ZESTRIL) 10 mg tablet Take 10 mg by mouth daily.     ??? lisinopril (PRINIVIL; ZESTRIL) 5 mg tablet Take one tablet by mouth daily. (Patient taking differently: Take 10 mg by mouth daily.) 90 tablet 0   ??? metFORMIN-ER(+) (FORTAMET) 1,000 mg extended release tablet Take one tablet by mouth daily with dinner. 90 tablet 3   ??? ranitidine(+) (ZANTAC) 150 mg tablet Take 150 mg by mouth twice daily.           Review of Systems:  Pertinent positive findings: Right lower extremity pain around wound.  Pertinent negative findings: Denies fevers or chills, headache, or pain in his other extremities.  Denies any right knee pain, or effusion.  Otherwise a 10 point review of systems was negative    Vital Signs:  Last Filed in 24 hours   BP: 120/65 (11/14 0734)  Temp: 36.8 ???C (98.2 ???F) (11/14 0734)  Pulse: 88 (11/14 0734)  Respirations: 16 PER MINUTE (11/14 0734)  SpO2: 98 % (11/14 0734)  O2 Delivery: None (Room Air) (11/14 0734)  SpO2 Pulse: 112 (11/13 2230)  Height: 182.9 cm (72) (11/14 0000)     Physical Exam:    Constitutional: AAOx3, NAD  Head: normocephalic, atraumatic  Eyes: EOMI, PERRLA  ENT: Oropharynx/Nasopharynx clear  Respiratory: Unlabored respirations, equal chest rise bilaterally  Cardiovascular: Regular rate and rhythm  Gastrointestinal: Soft, non-tender. No rebound tenderness Skin: Multiple abscesses in bilateral upper extremities and his forearms.  Left below-knee amputation with well-healed surgical incision.  2 small superficial wounds on the lateral aspect of his left stump.  Right lower extremity: Right lower extremity wound measuring 2 cm x 3 cm with a black eschar superficially.  There is a small area that is open and seems to be draining purulent material.  The wound is mildly tender to palpation with erythema in the surrounding margins.  2 small superficial wounds to his right posterior calf which did not seem to be involved with his larger wound.  Vascular: 2+ pulses to bilateral upper and  lower extremities  Musculoskeletal: Right lower extremity: Painless active and passive range of motion of the right knee.  Right knee without effusion or tenderness to palpation.  Palpable dorsalis pedis and posterior tibial artery on the right.  5 out of 5 strength in all muscle groups of the right lower extremity.  Left lower extremity: Left below-knee amputation stump is well-healed incision.  Skin as noted above.  Full active and passive range of motion without pain of the left knee.  Other extremities demonstrate no deficits with full active and passive painless range of motion.  Neurologic: Loss of sensation in his bilateral lower extremities from the knees distal.  No other focal neurologic deficits.  Cranial nerves II through XII grossly intact.  Psychiatric: Appropriate mood and bahvior    Lab/Radiology/Other Diagnostic Tests:    Radiology: Appropriate imaging obtained and reviewed.  Right tibia and fibula films demonstrate no osseous abnormality including osteomyelitis.  There is mild edema in the right lower extremity      CBC w/Diff   Lab Results   Component Value Date/Time    WBC 5.9 11/03/2017 04:47 AM    HGB 9.8 (L) 11/03/2017 04:47 AM    HCT 29.1 (L) 11/03/2017 04:47 AM    PLTCT 216 11/03/2017 04:47 AM           Basic Metabolic Profile   Lab Results Component Value Date/Time    NA 136 (L) 11/03/2017 04:47 AM    K 3.5 11/03/2017 04:47 AM    CL 104 11/03/2017 04:47 AM    CO2 25 11/03/2017 04:47 AM    GAP 7 11/03/2017 04:47 AM    BUN 7 11/03/2017 04:47 AM    CR 0.61 11/03/2017 04:47 AM    GLU 432 (H) 11/03/2017 04:47 AM        Coagulation Studies   Lab Results   Component Value Date/Time    PTT 29.6 08/26/2017 12:02 AM    INR 1.0 11/03/2017 04:47 AM            Casimiro Needle A. , MD  Pager [phone]

## 2017-11-03 NOTE — Progress Notes
Patient arrived to room # [phone](HC520) via cart accompanied by transport. Patient transferred to the bed with assistance. Bedside safety checks completed. Initial patient assessment completed, refer to flowsheet for details. Admission skin assessment completed by: Freddy FinnerKelsey Jaslyne Beeck, RN and Roney MarionAbigail Moyes, RN    Pressure Injury Present on Hospital Admission (within 24 hours): No    1. Occiput: No  2. Ear: No  3. Scapula: No  4. Spinous Process: No  5. Shoulder: No  6. Elbow: No  7. Iliac Crest: No  8. Sacrum/Coccyx: No  9. Ischial Tuberosity: No  10. Trochanter: No  11. Knee: No  12. Malleolus: No  13. Heel: No  14. Toes: No  15. Assessed for device associated injury Yes  16. Nursing Nutrition Assessment Completed Yes    See Doc Flowsheet for additional wound details.     INTERVENTIONS: Pt arrived to unit at approximately 2300. Pt A&O X4. Pt oriented to room and staff. Pt educated on fall risk bundle and bed alarm. Bed alarm on, fall bundle in place, call light within reach.

## 2017-11-03 NOTE — Consults
CONSULT DIABETES NURSE EDUCATOR [[id number]]   INPATIENT DIABETES EDUCATION TEAM ???[facility]    Reason for Consult:  Uncontrolled hyperglycemia     Barriers: homeless, lacks stable food supply, uninsured, lack of success in the past managing his diabetes and low motivation     Patient may benefit from: pt is willing to try low dose basal insulin -- [employer] voucher available if needed at d/c; bus passes--enough to get him to f/u appointments and resources ([facility] and [facility] where he's established care & patient assistance programs for insulins, and to food banks)      ADDENDUM:  After encounter with pt, I looked through his bag of PTA meds in the [facility].  Three Levemir pens, one Humalog pen and two Novolog pens, along with a box of pen needles were included.  Pt did not mention he's already taking Levemir, so if basal is needed, pt has a supply for now.  Pt was given a Glucocard meter last admission and this is not in his bag.    This consult team does not write orders. Primary Team is responsible for placing orders.    Supplies/Resources provided:   ???   Hyperglycemia   ???   Hypoglycemia  ???   [facility]rio bag    Met with Arty Lantzy to discuss barriers, difficulties, and problem solving diabetes management to promote wound healing.    Discussed with patient; handouts provided:   Diabetes disease process: Pt with long hx of type 2 DM, has had no success managing this due to multiple barriers.  Pt has established care at [facility] and [facility] where he plans to f/u, but needs bus pass to get there.    Monitoring and glucose goals: Pt given a new Glucocard meter last admission October, 2018.  Glucose levels:  Promoting wound healing thourhg improved BG levels discussed.  Discussed goals of 70-140 prior to meals, or up to 180 random glucose level.   Medication: One barrier to dosing mealtime insulin is lack of food.  What pt's eaten here since admission is the most I've eaten in four days. Hypoglycemia: Discussed potential for low BG sx's when his BG is trending towards healthier levels.  Pt has good understanding of these sx's, how to treat.  s/s,and treatment  ??? Hyperglycemia s/s, definition, prevention and treatment.  Pt recognizes effects of high BG on healing.  Provider:  Pt would benefit from regular access to bus pass to get to [employer] and [place].  Who to contact for abnormal results or emergencies, follow up for diabetes mgmt: Instructed to call their diabetes provider for updates on difficulties and needs in managing his diabetes.      Appreciate this consult.  Please contact Diabetes Educators with additional questions or concerns.      Dorothy Spark RN, CDE  Diabetes Educator  Office:  [phone]  Pager:  [phone]

## 2017-11-03 NOTE — ED Notes
Pt belongings: wheel chair, phone w/ charger, 2 pairs of pants, shirt, sweater, coat, bookbag, drivers license

## 2017-11-03 NOTE — Consults
CLINICAL NUTRITION                                                        Clinical Nutrition Assessment Summary     NAME:Bryan Bell             MRN: [id number]             DOB:1981-01-02          AGE: 36 y.o.  ADMISSION DATE: 11/02/2017             DAYS ADMITTED: LOS: 1 day    Nutrition Assessment of Patient:  BMI Categories Adult: Over Weight: 25-29.9  Unintentional Weight Loss: > 7.5% in 3 months (severe)  Malnutrition Assessment: Malnutrition present  Current Oral Intake: Inadequate  Estimated Calorie Needs: 1990-2262 kcal (22-25 kcal/kg present weight 90.5kg)  Estimated Protein Needs: 100-113gm (1.1-1.25gm/kg present weight 90.5kg)  Oral Diet Order: Diabetic 1600-2000 Kcal/day (60 g Carb/meal, 30 g Carb/HS snack)    ICD-10 code E43: Severe Malnutrition in context of Social or Environmental circumstances        Weight loss: > 7.5% x 3 months (Energy intake: 50% of less of est. energy needs for 1 month )  Malnutrition Intervention: Diabetic diet (2100-2400 kcal/day, 75gm/carb per meal), Boost glucose control supplement 1-2x/day, Social work to assist with providing transportation resources as able, thiamine supplementation.    Comments:  36 yo male with PMH of DM2, left BKA in 2012, COPD, HLD, HTN, depression, anxiety who presents today with R leg pain/wounds. Pt homeless. RD consulted to provide pt with information on local food banks. Pt was recently admitted here and was followed by RD services. HgbA1c 12.9%; diabetes educator following. Pt unable to properly manage diabetes d/t lack of medication and food supply. Pt receives food stamps and knows where to obtain meals, but does not have the means of transportation. Pt unable to state a UBW. Current body weight of 199# with evidence of 7.8% body weight change x 3 months (08/01/17-216#). Pts meal intake prior to admit is variable d/t homelessness, but recently pain has been limiting appetite/meal intake. He stated that he has eaten more here in 1 meal than he has over the past 4 days. Pt meets criteria for severe protein calorie malnutrition in the context of social and environmental circumstances given 7.8% body weight change x 3 months and estimated less than 50% of estimated nutrition needs met for greater than 1 month. RD provided pt with list of local food banks in [place], [place]; pt appreciative of information. Pt is willing to drink oral supplement while. RD encouraged pt to maintain an adequate intake of small, frequent high protein meals. +BM 11/12 and RLE non-pitting edema. RD will continue to monitor pt and assist as able.    Recommendation:  ??? Recommend 75gm carb/meal diabetic diet  ??? Boost glucose control 1-2x/day  ??? Thiamine supplementation  ??? Social work to assist in proving pt with resources, specifically regarding transportation (bus passes)                                Intervention / Plan:  Provided information on local food banks in the area  Promoted adequate intake of meals with high protein sources. Boost supplement 1-2x/day  Will monitor pts labs, meds, wt, gi function, i/os, and need for further nutrition intervention    Nutrition Diagnosis:  Inadequate oral intake  Etiology: Decreased ability to consume sufficient energy  Signs & Symptoms: Decreased appetite d/t pain, limited access to resources d/t homelessness                      Goals:  Patient to consume >75% of meals/supplements  Time Frame: Throughout Stay                Marca Ancona RD, LD [phone]

## 2017-11-04 ENCOUNTER — Encounter: Admit: 2017-11-04 | Discharge: 2017-11-04 | Payer: MEDICAID

## 2017-11-04 DIAGNOSIS — E119 Type 2 diabetes mellitus without complications: Principal | ICD-10-CM

## 2017-11-04 DIAGNOSIS — J449 Chronic obstructive pulmonary disease, unspecified: ICD-10-CM

## 2017-11-04 DIAGNOSIS — E785 Hyperlipidemia, unspecified: ICD-10-CM

## 2017-11-04 DIAGNOSIS — F99 Mental disorder, not otherwise specified: ICD-10-CM

## 2017-11-04 DIAGNOSIS — I10 Essential (primary) hypertension: ICD-10-CM

## 2017-11-04 LAB — CBC AND DIFF: Lab: 6.9 K/UL (ref >60)

## 2017-11-04 LAB — VANCOMYCIN TROUGH: Lab: 7.9 ug/mL — ABNORMAL LOW (ref 10.0–20.0)

## 2017-11-04 LAB — POC GLUCOSE
Lab: 110 mg/dL — ABNORMAL HIGH (ref 70–100)
Lab: 212 mg/dL — ABNORMAL HIGH (ref 70–100)
Lab: 240 mg/dL — ABNORMAL HIGH (ref 70–100)
Lab: 310 mg/dL — ABNORMAL HIGH (ref 70–100)

## 2017-11-04 LAB — COMPREHENSIVE METABOLIC PANEL
Lab: 135 MMOL/L — ABNORMAL LOW (ref >60)
Lab: 3.6 MMOL/L — ABNORMAL HIGH (ref >60)

## 2017-11-04 MED ORDER — FENTANYL CITRATE (PF) 50 MCG/ML IJ SOLN
25 ug | Freq: Once | INTRAVENOUS | 0 refills | Status: CP
Start: 2017-11-04 — End: ?
  Administered 2017-11-04: 07:00:00 25 ug via INTRAVENOUS

## 2017-11-04 MED ORDER — POLYETHYLENE GLYCOL 3350 17 GRAM PO PWPK
1 | Freq: Every day | ORAL | 0 refills | Status: DC
Start: 2017-11-04 — End: 2017-11-06
  Administered 2017-11-04 – 2017-11-06 (×3): 17 g via ORAL

## 2017-11-04 MED ORDER — INSULIN GLARGINE 100 UNIT/ML (3 ML) SC INJ PEN
35 [IU] | Freq: Every evening | SUBCUTANEOUS | 0 refills | Status: DC
Start: 2017-11-04 — End: 2017-11-05

## 2017-11-04 MED ORDER — VANCOMYCIN 2,000 MG IVPB
2 g | Freq: Two times a day (BID) | INTRAVENOUS | 0 refills | Status: DC
Start: 2017-11-04 — End: 2017-11-08
  Administered 2017-11-05 – 2017-11-08 (×16): 2 g via INTRAVENOUS

## 2017-11-04 MED ORDER — VITS A AND D-WHITE PET-LANOLIN TP OINT
TOPICAL | 0 refills | Status: DC | PRN
Start: 2017-11-04 — End: 2017-11-20
  Administered 2017-11-04: 22:00:00 60 g via TOPICAL

## 2017-11-04 MED ORDER — GADOBENATE DIMEGLUMINE 529 MG/ML (0.1MMOL/0.2ML) IV SOLN
18 mL | Freq: Once | INTRAVENOUS | 0 refills | Status: CP
Start: 2017-11-04 — End: ?
  Administered 2017-11-04: 08:00:00 18 mL via INTRAVENOUS

## 2017-11-04 MED ORDER — VANCOMYCIN PHARMACY TO MANAGE
1 | 0 refills | Status: DC
Start: 2017-11-04 — End: 2017-11-08

## 2017-11-04 NOTE — Case Management (ED)
Case Management Progress Note    NAME:Bryan Bell                          MRN: [id number]              DOB:1981-04-04          AGE: 36 y.o.  ADMISSION DATE: 11/02/2017             DAYS ADMITTED: LOS: 2 days      Todays Date: 11/04/2017    Plan  Pt to continue inpt care. CM to continue to follow for ID recommendations for Abx at d/c - oral vs IV. Anticipate pt will require bus pass at d/c and possibly medication voucher. Pt determining if he will return to hotel room where he has been staying with his friend at d/c.     Interventions  ? Support      ? Info or Referral      ? Discharge Planning      ? Medication Needs   Medication Needs: Other      Awaiting final ID recommendations for Abx (oral vs IV). Anticipate possible PO zyvox vs doxycycline at d/c. SW checked prices of zyvox 600mg  BID #20 in [facility] ([facility] [place]- $45.32, [name]- $326.25). Pt is currently eligible for a medication voucher. SW updated Med D team.      ? Financial   Financial: Mining engineerCardon Referral/Follow-up, Financial Counseling Referral/Follow-up      [facility] refer pt to [facility]Data to f/u on status of pt's Medicaid and SS disability applications       ? Legal      ? Other        Disposition  ? Expected Discharge Date    Expected Discharge Date: 11/06/17  ? Transportation   Does the patient need discharge transport arranged?: Yes  Transportation Name, Phone and Availability #1: bus pass  Does the patient use Medicaid Transportation?: No  ? Next Level of Care (Acute Psych discharges only)      ? Discharge Disposition                                          Durable Medical Equipment     No service has been selected for the patient.      [place] [facility]     No service has been selected for the patient.      [facility]     No service has been selected for the patient.      [facility]     No service has been selected for the patient.          Redgie Grayerhristina Isabelly Kobler, LMSW  p. [phone]

## 2017-11-04 NOTE — Consults
Wound Ostomy Note    NAME:Bryan Bell                                                                   MRN: [id number]                 DOB:05-Mar-1981          AGE: 36 y.o.  ADMISSION DATE: 11/02/2017             DAYS ADMITTED: LOS: 2 days      Reason for Consult: wound not pressure    Assessment/Plan:    Active Problems:    Wound, open, back    Uncontrolled type 2 diabetes mellitus with hyperglycemia, with long-term current use of insulin (HCC)    Hx of BKA, left (HCC)    Homelessness    Sepsis due to methicillin resistant Staphylococcus aureus (MRSA) (HCC)    Medical non-compliance    Severe malnutrition (HCC)      Wounds (NOT for Pressure Injuries) 11/03/17 0026 Right;Outer;Lower Leg Ulcer (not from pressure) (Active)   11/03/17 0026 Leg   Wound Orientation: Right;Outer;Lower   Wound Type: Ulcer (not from pressure)   Wound Type::    Wound Description (Comments):    Wound Base Assessment Black;Eschar;Moist;Pink 11/04/2017 10:50 AM   Surrounding Skin Assessment Dry;Red 11/04/2017 10:50 AM   Wound Site Closure None 11/04/2017 10:50 AM   Wound Drainage Amount Small 11/04/2017 10:50 AM   Wound Drainage Description Tan;Purulent 11/04/2017 10:50 AM   Wound Dressing Status None 11/04/2017 10:50 AM   Wound Dressing and / or Treatment Restore Silver Contact Layer;Foam (Biatain) 11/04/2017 10:50 AM   Wound Length (cm) 4 cm 11/04/2017 10:50 AM   Wound Width (cm) 4.5 cm 11/04/2017 10:50 AM   Wound Depth (cm) 0.4 cm 11/04/2017 10:50 AM   Wound Surface Area (cm^2) 18 cm^2 11/04/2017 10:50 AM   Wound Volume (cm^3) 7.2 cm^3 11/04/2017 10:50 AM       Wounds (NOT for Pressure Injuries) 11/03/17 0000 Distal;Left Leg Ulcer (not from pressure) wounds x 2 (less than 1.0cm apart) (Active)   11/03/17 0000 Leg   Wound Orientation: Distal;Left   Wound Type: Ulcer (not from pressure)   Wound Type:: wounds x 2 (less than 1.0cm apart)   Wound Description (Comments):    Wound Base Assessment Moist;Pink 11/04/2017 10:50 AM Surrounding Skin Assessment Dry;Scab 11/04/2017 10:50 AM   Wound Site Closure None 11/04/2017 10:50 AM   Wound Drainage Amount Scant 11/04/2017 10:50 AM   Wound Drainage Description Serous 11/04/2017 10:50 AM   Wound Dressing Status None 11/04/2017 10:50 AM   Wound Dressing and / or Treatment Restore Silver Contact Layer;Foam (Biatain) 11/04/2017 10:50 AM   Wound Length (cm) 4 cm 11/04/2017 10:50 AM   Wound Width (cm) 2 cm 11/04/2017 10:50 AM   Wound Depth (cm) 0.1 cm 11/04/2017 10:50 AM   Wound Surface Area (cm^2) 8 cm^2 11/04/2017 10:50 AM   Wound Volume (cm^3) 0.8 cm^3 11/04/2017 10:50 AM   Number of days: 1       Wounds (NOT for Pressure Injuries) 11/03/17 0000 Left;Lateral Arm Abscess (Active)   11/03/17 0000 Arm   Wound Orientation: Left;Lateral   Wound Type: Abscess   Wound Type::  Wound Description (Comments):    Wound Base Assessment Dry;Black;Eschar 11/04/2017 10:50 AM   Surrounding Skin Assessment Pink;Intact;Induration 11/04/2017 10:50 AM   Wound Site Closure None 11/04/2017 10:50 AM   Wound Drainage Amount None 11/04/2017 10:50 AM   Wound Drainage Description Serosanguineous 11/03/2017  8:12 PM   Wound Dressing Status None;Open to Air 11/04/2017 10:50 AM   Wound Length (cm) 2.5 cm 11/04/2017 10:50 AM   Wound Width (cm) 2 cm 11/04/2017 10:50 AM   Wound Depth (cm) 0.1 cm 11/04/2017 10:50 AM   Wound Surface Area (cm^2) 5 cm^2 11/04/2017 10:50 AM   Wound Volume (cm^3) 0.5 cm^3 11/04/2017 10:50 AM   Number of days: 1       Wounds (NOT for Pressure Injuries) 11/03/17 0000 Right Scapula Ulcer (not from pressure) (Active)   11/03/17 0000 Scapula   Wound Orientation: Right   Wound Type: Ulcer (not from pressure)   Wound Type::    Wound Description (Comments):    Wound Base Assessment Moist;Red 11/04/2017 10:50 AM   Surrounding Skin Assessment Dry;Pink 11/04/2017 10:50 AM   Wound Site Closure None 11/04/2017 10:50 AM   Wound Drainage Amount Scant 11/04/2017 10:50 AM Wound Drainage Description Serous 11/04/2017 10:50 AM   Wound Dressing Status None 11/04/2017 10:50 AM   Wound Dressing and / or Treatment Restore Silver Contact Layer;Foam (Biatain) 11/04/2017 10:50 AM   Wound Length (cm) 1 cm 11/04/2017 10:50 AM   Wound Width (cm) 3 cm 11/04/2017 10:50 AM   Wound Depth (cm) 0.1 cm 11/04/2017 10:50 AM   Wound Surface Area (cm^2) 3 cm^2 11/04/2017 10:50 AM   Wound Volume (cm^3) 0.3 cm^3 11/04/2017 10:50 AM       Wounds (NOT for Pressure Injuries) 11/04/17 1034 Left;Lower Scapula Ulcer (not from pressure) (Active)   11/04/17 1034 Scapula   Wound Orientation: Left;Lower   Wound Type: Ulcer (not from pressure)   Wound Type::    Wound Description (Comments):    Wound Base Assessment Dry;Tan;Eschar 11/04/2017 10:50 AM   Surrounding Skin Assessment Dry;Intact;Pink 11/04/2017 10:50 AM   Wound Site Closure None;Open to Air 11/04/2017 10:50 AM   Wound Drainage Amount None 11/04/2017 10:50 AM   Wound Dressing Status None;Open to Air 11/04/2017 10:50 AM   Wound Dressing and / or Treatment A & D Ointment 11/04/2017 10:50 AM   Wound Length (cm) 1.5 cm 11/04/2017 10:50 AM   Wound Width (cm) 1.5 cm 11/04/2017 10:50 AM   Wound Depth (cm) 0.1 cm 11/04/2017 10:50 AM   Wound Surface Area (cm^2) 2.25 cm^2 11/04/2017 10:50 AM   Wound Volume (cm^3) 0.22 cm^3 11/04/2017 10:50 AM   Number of days: 0     Pressure Injury 11/04/17 1034 Right;Mid;Outer Stage 2 (Active)   11/04/17 1034    Wound Location: Foot   Pressure Injury Orientation: Right;Mid;Outer   Pressure Injury Stages: Stage 2   Pressure Injury Present On Inpatient Admission: N   If this pressure injury is suspected to be device related, please select the device::    Wound Dressing Status None;Open to Air 11/04/2017 10:50 AM   Wound Drainage Description Serous filled blister 11/04/2017 10:50 AM   Wound Drainage Amount None 11/04/2017 10:50 AM   Surrounding Skin Assessment Dry;Red;Purple 11/04/2017 10:50 AM Wound Status (Wound Team Only) Being Treated 11/04/2017 10:50 AM   Wound Length (cm) 2 cm 11/04/2017 10:50 AM   Wound Width (cm) 2 cm 11/04/2017 10:50 AM   Wound Depth (cm) 0.1 cm 11/04/2017 10:50 AM   Wound  Surface Area (cm^2) 4 cm^2 11/04/2017 10:50 AM   Wound Volume (cm^3) 0.4 cm^3 11/04/2017 10:50 AM     Ortho Surgery is following right lateral lower leg wound - MRI has been obtained. Unclear if surgical intervention is planned for debridement of wound.     Right lateral foot with Stage 2 pressure injury presenting as a serous filled blister. Bilateral forearms with wounds in various stages of healing. Bilateral scapula with wounds. Pt reports he cannot feel his right foot; hx of BKA of LLE and wounds on posterior surface of stump.    Pt reports he is homeless, living on the streets and has no insurance. Pt does note he has a friend who sneaks him into a hotel room on colder nights.     RECOMMEND:  Right scapula + Right lateral lower leg -  - Clean with saline and gauze.  - Education officer, environmental Silver contact layer over wounds then cover with Biatain Silicone foam dressing.   - Change q48 hrs by bedside RN.    Right lateral foot -   - Clean with saline and gauze.  - Cover with Biatain Silicone foam dressing.   - Change q72 hrs.   **If blister ruptures, cover with Hollister Restore Silver contact layer then foam dressing. Change q48 hrs.**    Scabbed abrasions on bilateral forearms, left scapula, lower extremities -   - Apply thick layer of Vitamin A&D ointment over wounds BID.       Primary team is responsible for placing wound care orders, including order for Vitamin A&D ointment as this comes from Pharmacy. Bedside RN is responsible for dressing changes. Dressing supplies available on unit supply cart or from Materials Mgmt [phone].    Will continue to follow.    Marcelino Duster, RN, BSN, [employer] Nursing Consult Service  Office: [phone]  Pager: [phone] [place] [facility] Team Pager (After Hours/Weekends): [phone]

## 2017-11-04 NOTE — Case Management (ED)
Case Management Progress Note    NAME:Bryan Bell                          MRN: [id number]              DOB:[birth year]          AGE: 36 y.o.  ADMISSION DATE: 11/02/2017             DAYS ADMITTED: LOS: 2 days      Todays Date: 11/04/2017    Plan  Pt to d/c when medically stable      Interventions  ? Support      ? Info or Referral      ? Discharge Planning      ? Medication Needs  Cash Beauchaine for current Abx is $1000/wk and pt has no money and is homeless  ID is looking to switch to oral if at all possible  Zyvox vs Doxy.  NCm will continue too follow.  No therapy recommendations at this time.  No other CM needs identified at this time  NCm will continue to assess and monitor for changes       ? Financial      ? Legal      ? Other        Disposition  ? Expected Discharge Date    Expected Discharge Date: 11/06/17  ? Transportation   Does the patient need discharge transport arranged?: Yes  Transportation Name, Phone and Availability #1: bus pass  Does the patient use Medicaid Transportation?: No  ? Next Level of Care (Acute Psych discharges only)      ? Discharge Disposition                                          Durable Medical Equipment     No service has been selected for the patient.      [place] [facility]     No service has been selected for the patient.      [facility]     No service has been selected for the patient.      [facility]     No service has been selected for the patient.          Zollie BeckersKurt Amandajo Gonder RN-NCM  Case Management Department  [phone]  [phone]

## 2017-11-04 NOTE — Progress Notes
Pt arrived back on unit from MRI. Pt still complaining of pain. Will give tylenol and reassess.

## 2017-11-04 NOTE — Progress Notes
Pharmacy Vancomycin Note  Subjective:   Bryan Bell is a 36 y.o. male being treated for Sepsis, wound infections.    Objective:     Current Vancomycin Orders   Medication Dose Route Frequency    vancomycin (VANCOCIN) 2 g in sodium chloride 0.9% (NS) IVPB  2 g Intravenous Q12H*    And    vancomycin, pharmacy to manage  1 each Service Per Pharmacy     Start Date of  Vancomycin therapy: 11/02/2017  Additional Abx: Zosyn  Cultures:  ,  ,  ,    White Blood Cells   Date/Time Value Ref Range Status   11/04/2017 0508 6.9 4.5 - 11.0 K/UL Final   11/03/2017 0447 5.9 4.5 - 11.0 K/UL Final   11/02/2017 1825 6.3 4.5 - 11.0 K/UL Final     Creatinine   Date/Time Value Ref Range Status   11/04/2017 0508 0.69 0.4 - 1.24 MG/DL Final   45/40/981111/14/2018 [id number] 0.61 0.4 - 1.24 MG/DL Final   82/95/621311/13/2018 [id number] 0.69 0.4 - 1.24 MG/DL Final     Blood Urea Nitrogen   Date/Time Value Ref Range Status   11/04/2017 0508 7 7 - 25 MG/DL Final     Estimated CrCl:      Intake/Output Summary (Last 24 hours) at 11/04/17 1438  Last data filed at 11/04/17 1233   Gross per 24 hour   Intake             1200 ml   Output             1125 ml   Net               75 ml      UOP:    Actual Weight:  90.5 kg (199 lb 9.6 oz)  Dosing BW:  90.5 kg   Drug Levels:  Vancomycin Trough   Date/Time Value Ref Range Status   11/04/2017 0841 7.9 (L) 10.0 - 20.0 MCG/ML Final       Assessment:   Target levels for this patient: ~15.  Evaluation of level(s): Vanco trough drawn appropriately and is subtherapeutic    Plan:   1. Increase vancomycin to 2g q 12h.  2. Next scheduled level(s): Prior to 4th dose of current regimen  3. Pharmacy will continue to monitor and adjust therapy as needed.    Teodora Mediciatrick O'Day, [facility]ARMD  11/04/2017

## 2017-11-04 NOTE — Case Management (ED)
Case Management Admission Assessment    NAME:Bryan Bell                          MRN: [id number]             DOB:06-23-1981          AGE: 36 y.o.  ADMISSION DATE: 11/02/2017             DAYS ADMITTED: LOS: 2 days      Today???s Date: 11/04/2017    Source of Information: Patient       Plan  Plan: CM Assessment, Assist PRN with SW/NCM Services, Other (comment)     Pt to continue inpt care. CM to continue to follow for ID recommendations for Abx at d/c - oral vs IV. Anticipate pt will require bus pass at d/c. Pt determining if he will return to hotel room where he has been staying with his friend at d/c.     Per chart, pt is a 36 yo male with PMH of DM2, left BKA in 2012, COPD, HLD, HTN, depression, anxiety who presents today with R leg pain/wounds.    Patient Address/Phone  [address]  [place] [address]  [phone] (home)     Emergency Contact  Extended Emergency Contact Information  Primary Emergency Contact: Dewey,Beatrice   [place]  Home Phone: [phone]  Relation: Mother  Secondary Emergency Contact: Engelstad,Beatrice   [place]  Home Phone: [phone]  Relation: Mother    Healthcare Directive  Healthcare Directive: No, patient does not have a healthcare directive  Would patient like to fill out a (a new) Healthcare Directive?: No, patient declined  Psych Advance Directive (Psych unit only): No, patient does not have a Psych Advance Directive     Pt denies completing DPOA previously and is not interested in completing one at this time    Transportation  Does the patient need discharge transport arranged?: Yes  Transportation Name, Phone and Availability #1: bus pass  Does the patient use Medicaid Transportation?: No     Pt reports he uses the bus system to get to appointments    Expected Discharge Date  Expected Discharge Date: 11/06/17    Living Situation Prior to Admission  ? Living Arrangements  Type of Residence: Homeless Shelter Name: pt states he has been staying with a friend at a hotel in [place], [place] and plans to possibly return there at d/c  ? Level of Function   Prior level of function: Independent  ? Cognitive Abilities   Cognitive Abilities: Alert and Oriented, Participates in decision making     Pt denies concerns with d/c at this time.    Financial Resources  ? Coverage  Primary Insurance: No insurance  Secondary Insurance: No insurance  Additional Coverage: None (Pt reports he has been filling his medications at [facility] at [address]. Pt reports he has been able to obtain all of his medications.)    ? Source of Income   Source Of Income: Food Assistance (Pt states he receives food stamps and denies need for information on food pantries)  ? Financial Assistance Needed?  Yes, pt does not have health coverage. Pt states he is reapplying for for Medicaid through the [facility] office and states he has a lawyer that is assisting him with disability. SW requesting Hospital FCs/MedData f/u.    Psychosocial Needs  ? Mental Health  Mental Health History: Yes (Per chart, h/o Bipolar  Mood Disorder. Pt reports he is prescribed bupropion but states he has not been taking it. Pt denies acute MH concerns or needs at this time.)  ? Substance Use History  Substance Use History Screen: No (Pt denies substance use. Per chart, tobacco use.)  ? Other  N/A    Current/Previous Services  ? PCP  Nelson-Bowls, Rae, None, None    Pt confirmed he is still following at [facility] for primary care. Pt states he is planning to request a CM at [facility] for assistance with housing.    ? Pharmacy    [facility])  [phone]  [place] [address]  Phone: [phone] Fax: [phone]    [facility] [place], [place] - [address]  [address]  [place] [address]  Phone: [phone] Fax: [phone]    [facility] [address], [place] - [address] [place] [address] [place]  [place] [address]  Phone: [phone] Fax: [phone]    ? Durable Medical Equipment   Durable Medical Equipment at home: Wheelchair (manual) [facility] in pt's room)  ? Home Health  Receiving home health: No  ? Hemodialysis or Peritoneal Dialysis  Undergoing hemodialysis or peritoneal dialysis: No  ? Tube/Enteral Feeds  Receive tube/enteral feeds: No  ? Infusion  Receive infusions: No  ? Private Duty  Private duty help used: No  ? Home and Community Based Services  Home and community based services: No  ? [employer]: N/A  ? Hospice  Hospice: No  ? Outpatient Therapy  PT: No  OT: No  SLP: No  ? Skilled Nursing Facility/Nursing Home  SNF: In the past  When did patient receive care?: 2013  Name of Facility: pt unable to recall- SNF in [facility]  NH: No  ? Inpatient Rehab  IPR: No  ? Long-Term Acute Care Hospital  LTACH: No  ? Acute Hospital Stay  Acute Hospital Stay: Yes  Was patient's stay within the last 30 days?: Yes  When did patient receive care?: 10/03/17-10/05/17   Name of hospital: [facility]  Readmission Code Group: 2. Patient / Caregiver Issues  2. Patient / Caregiver Issues: 2a. Compliance (Admitted 10/14 for LLE pain, scrotal pain, left forearm abscess, RLE edema, and hyperglycemia. Pt readmitted 11/13 for sepsis due to right LE wound and multiple lesions on back and arms.)  Related or Unrelated?: Unrelated      Redgie Grayer, LMSW  p. [phone]

## 2017-11-04 NOTE — Progress Notes
Infectious Diseases Progress Note    11/04/2017    Admission Date: 11/02/2017  LOS: 2 days                     IMPRESSION:  1. Recurrent MRSA abscess. 11/02/17 with lateral right lower leg abscess.   2. psychosocioeconomic circumstance  3. Diabetes mellitus type 1  4. H/o left BKA  5. Pruritis and body scratching  6. anemia  ???  ???  ???  ???  PLAN:  1. Cont mupirocin to nares  2. Use CHG bathing  3. F/u blood cultures  4. Await final ortho opinion  5. Cont current abx while awaiting wound culture.   6. Monitor for drug adverse reaction/toxicity  7. Refuses influenza vaccine.   8. Will follow    ________________________________________________________________________    Interval history:  Bryan Bell is a 36 y.o. male.  Patient afebrile. vanc level low. Cr good. No f/c/ns, cough, SOB, abd pain. +RLE pain. Taking PO.           ABX:  Vanc, zosyn     Medications  Scheduled Meds:  enoxaparin (LOVENOX) syringe 40 mg 40 mg Subcutaneous QDAY(21)   insulin aspart U-100 (NOVOLOG FLEXPEN) injection PEN 0-7 Units 0-7 Units Subcutaneous ACHS   insulin aspart U-100 (NOVOLOG FLEXPEN) injection PEN 15 Units 15 Units Subcutaneous TID w/ meals   insulin glargine (LANTUS SOLOSTAR, BASAGLAR) injection PEN 35 Units 35 Units Subcutaneous QHS(22)   mupirocin (BACTROBAN) 2 % topical ointment  Topical BID   piperacillin/tazobactam  (ZOSYN) 3.375 g/50 mL iso-osmotic IVPB 3.375 g Intravenous Q6H*   polyethylene glycol 3350 (MIRALAX) packet 17 g 1 packet Oral QDAY   vancomycin (VANCOCIN) 1,500 mg in dextrose 5% (D5W) IVPB 15 mg/kg Intravenous Q12H*   Continuous Infusions:  ??? sodium chloride 0.9 %   infusion 125 mL/hr at 11/04/17 0652     PRN and Respiratory Meds:acetaminophen Q6H PRN, HYDROcodone/acetaminophen Q4H PRN, ondansetron (ZOFRAN) IV Q6H PRN, traZODone QHS PRN, vancomycin   IVPB Q12H* **AND** vancomycin, pharmacy to manage Per Pharmacy      Objective                       Vital Signs: Last Filed                 Vital Signs: 24 Hour Range   BP: 142/75 (11/15 0714)  Temp: 36.6 ???C (97.9 ???F) (11/15 1610)  Pulse: 90 (11/15 0714)  Respirations: 16 PER MINUTE (11/15 0714)  SpO2: 99 % (11/15 0714)  O2 Delivery: None (Room Air) (11/15 0745) BP: (115-142)/(62-75)   Temp:  [36.5 ???C (97.7 ???F)-37.5 ???C (99.5 ???F)]   Pulse:  [64-96]   Respirations:  [15 PER MINUTE-16 PER MINUTE]   SpO2:  [96 %-99 %]   O2 Delivery: None (Room Air)   Intensity Pain Scale (Self Report): 9 (11/04/17 0236) Vitals:    11/02/17 1743 11/03/17 0000   Weight: 99.8 kg (220 lb) 90.5 kg (199 lb 9.6 oz)           Physical Exam  Gen: A&O X 3, no acute distress  HEENT:  EOMI, no subconjunctival hemorrhages  CV: RRR  Resp: Clear bilat, air exchange good   Abd, soft, non tender, +BS  Extremity: L BKA, RLE with lateral wound with eschar, mild surrounding erythema with purulent base, +ttp  Derm: no rash/lesions appreciated          Lab Review  Full labs reviewed.  Recent Labs      11/04/17   0508   WBC  6.9           Radiology and other Diagnostics Review: Radiology viewed and reports reviewed    11/04/17 MRI:  IMPRESSION  1. ???Soft tissue ulceration overlying the lateral head of the fibula   without evidence of osteomyelitis.    2. ???Skin thickening, edema and enhancement of the subcutaneous soft   tissues of the leg, nonspecific, but can be seen in setting of cellulitis.   No drainable soft tissue fluid collection is identified.    3. ???Mild edema and enhancement within the multiple proximal leg muscles,   suggestive of myositis, as described above. No drainable intramuscular   fluid collection.    I have interviewed and examined this patient today and viewed the vital signs, laboratory, microbiology and radiology.    Tobi Bastos, MD      Tobi Bastos, MD  Infectious Diseases Faculty  Pager: [phone]

## 2017-11-04 NOTE — Progress Notes
OCCUPATIONAL THERAPY  ASSESSMENT NOTE    Patient Name: Bryan Bell                   Room/Bed: VH846/96  Admitting Diagnosis:     Mobility  Progressive Mobility Level: Active transfer to chair  Level of Assistance: Assist X1  Assistive Device: Wheelchair  Time Tolerated: 0-10 minutes  Activity Limited By: Pain    Subjective  Pertinent Dx per Physician:  36 y.o. male with past medical history of uncontrolled diabetes, COPD, hyperlipidemia, hypertension, depression, anxiety, multiple abscesses, MRSA, and sleep apnea who presented to the emergency department on 11/02/2017 with right lower extremity pain and wound with hematogenous and purulent drainage. He uses a wheelchair in his day-to-day life due to a history of a left below-knee amputation.  Precautions: Falls  Pain / Complaints: Patient agrees to participate in therapy  Pain Location: Right;Leg  Comments: Pt supine in bed at OT arrival and agreeable to participate in therapy. Aide in room to assist with bathing at OT exit    Objective  Psychosocial Status: Willing and Cooperative to Participate    Home Living  Type of Home: Homeless  Home Equipment: Wheelchair-manual  Comment: Pt reports previous rehab stay after BKA and has been ambulating with walker with prosthetic, however prosthetic no longer fits    Prior Function  Level Of Independence: Independent with ADLs and functional transfers  Other Function Comments: Pt reports recently having more trouble donning pants and sock, he is unsure of the cause. He uses a wheelchair for mobility at this time    ADL's  Where Assessed: Edge of Bed  Toileting Assist: Stand By Assist  Toileting Deficits: Setup;Use of Bedpan/Urinal Setup  Functional Transfer Assist: Stand By Assist  Functional Transfer Deficits: Steadying;Supervision/Safety  Comment: Pt completes bed mobility and squat pivot transfer with standby assist and assist for steadying wheelchair. Pt requests shower, aide present in room agreeable to assist patient to shower at this time    Activity Tolerance  Endurance: 2/5 Tolerates 10-20 Minutes Exercise w/Multiple Rests  Sitting Balance: 4/5 Moves/Returns Trunkal Midpoint 1-2 Inches in Multiple Planes    Cognition  Overall Cognitive Status: WFL to Adequately Complete Self Care Tasks Safely    Education  Persons Educated: Patient  Teaching Methods: Verbal Instruction  Patient Response: Verbalized Understanding  Topics: Role of OT, Goals for Therapy    Assessment  Assessment: Decreased ADL Status;Decreased Endurance;Decreased Sensation;Decreased High-Level ADLs  Prognosis: Fair  Goal Formulation: Patient    AM-PAC 6 Clicks Daily Activity Inpatient  Putting on and taking off regular lower body clothes?: A Lot  Bathing (Including washing, rinsing, drying): A Lot  Toileting, which includes using toilet, bedpan, or urinal: A Lot  Putting on and taking off regular upper body clothing: None  Taking care of personal grooming such as brushing teeth: None  Eating meals?: None  Daily Activity Raw Score: 18  Standardized (t-scale) score: 38.66  CMS 0-100% Score: 46.65  CMS G Code Modifier: CK    Plan  OT Frequency: 3-5x/week  OT Plan for Next Visit: LB dressing, toilet transfer    ADL Goals  Patient Will Perform LE Dressing: w/ Stand By Assist  Patient Will Perform Toileting: w/ Stand By Assist    Functional Transfer Goals  Pt Will Perform All Functional Transfers: w/ Stand By Assist      OT Discharge Recommendations  Do not anticipate post acute OT needs at this time. Defer to case management for discharge  disposition.  Additional Information: Patient would benefit from information on donated or reduced [employer] DME due to poor condition of current wheelchair.      Therapist: Lorella Nimrod, OTR/L [phone]  Date: 11/04/2017

## 2017-11-04 NOTE — Anesthesia Pre-Procedure Evaluation
Anesthesia Pre-Procedure Evaluation    Name: Bryan Bell      MRN: [id number]     DOB: 08/07/1981     Age: 36 y.o.     Sex: male   __________________________________________________________________________     Procedure Date: 11/05/2017   Procedure: Procedure(s) with comments:  DEBRIDEMENT WOUND DEEP 20 SQ CM OR LESS - LOWER EXTREMITY - CASE LENGTH 2 HOURS     Physical Assessment  Vital Signs (last filed in past 24 hours):  BP: 126/79 (11/16 1305)  Temp: 37 ???C (98.6 ???F) (11/16 1305)  Pulse: 82 (11/16 1305)  Respirations: 13 PER MINUTE (11/16 1305)  SpO2: 100 % (11/16 1305)  O2 Delivery: None (Room Air) (11/16 1305)      Patient History  No Known Allergies     Current Medications    Medication Directions   atorvastatin (LIPITOR) 20 mg tablet Take 20 mg by mouth daily.   blood sugar diagnostic (GLUCOCARD EXPRESSION) test strip Use one strip as directed before meals and at bedtime.   Blood-Glucose Meter (GLUCOCARD EXPRESSION) kit Use as directed   buPROPion XL (WELLBUTRIN XL) 150 mg tablet Take 150 mg by mouth every morning. Do not crush or chew.   insulin glargine (LANTUS SOLOSTAR, BASAGLAR) 100 unit/mL (3 mL) injection PEN Inject 90 Units under the skin at bedtime daily.   insulin lispro(+) (HUMALOG U-100 INSULIN) 100 unit/mL injection Inject 10 units under the skin three times daily before meals.  Patient taking differently: Inject 30-40 Units under the skin three times daily before meals.   insulin pen needles (disposable) (BD UF NANO PEN NEEDLES) 32 gauge x 5/32 pen needle Use one each as directed as Needed. Use with insulin injections.   lancets 30 guage 30 gauge Use 1 each as directed four times daily as needed.   lisinopril (PRINIVIL; ZESTRIL) 10 mg tablet Take 10 mg by mouth daily.   ranitidine(+) (ZANTAC) 150 mg tablet Take 150 mg by mouth twice daily.         Review of Systems/Medical History        PONV Screening: Postoperative opioids No history of anesthetic complications ( Multiple surgeries at OSH without anesthesia complications)        Pulmonary       Current smoker ( ppd for 10 years)        COPD ( Has not used an inhalor in years ), mild       Cardiovascular       Recent diagnostic studies:          echocardiogram          07/21/2016  1. Normal left ventricular systolic function with an EF of 60%  2. No regional wall motion abnormalities  3. Normal left ventricular diastolic function  4. Normal right ventricular size and function  5. Normal sized atria bilaterally  6. No significant valvular disease identified  7. Pulmonary systolic pressure could not be accurately estimated due to lack of significant tricuspid regurgitant jet  8. No pericardial effusion      Exercise tolerance: Wheelchair bound.      Beta Blocker therapy: No        Hypertension, poorly controlled      Hyperlipidemia      GI/Hepatic/Renal - negative        Neuro/Psych           Psychiatric history          Depression  Bipolar disorder          Anxiety      Musculoskeletal         Uses wheelchair for mobility.  BKA in past.      Endocrine/Other       Diabetes, poorly controlled, type 2; using insulin   Physical Exam    Airway Findings      Mallampati: I      TM distance: >3 FB      Neck ROM: full      Mouth opening: good    Dental Findings:       Poor dentition and increased risk for dental injury; pt advised      Comments: All front teeth broken and/or chipped.    Pulmonary Findings:       Breath sounds clear to auscultation.       Diagnostic Tests  Hematology:   Lab Results   Component Value Date    HGB 10.3 11/05/2017    HCT 30.7 11/05/2017    PLTCT 267 11/05/2017    WBC 5.6 11/05/2017    NEUT 49 11/05/2017    ANC 2.80 11/05/2017    ALC 2.30 11/05/2017    MONA 6 11/05/2017    AMC 0.40 11/05/2017    EOSA 2 11/05/2017    ABC 0.00 11/05/2017    MCV 83.0 11/05/2017    MCH 27.7 11/05/2017    MCHC 33.4 11/05/2017    MPV 7.7 11/05/2017    RDW 14.9 11/05/2017 General Chemistry:   Lab Results   Component Value Date    NA 138 11/05/2017    K 3.9 11/05/2017    CL 105 11/05/2017    CO2 26 11/05/2017    GAP 7 11/05/2017    BUN 9 11/05/2017    CR 0.63 11/05/2017    GLU 287 11/05/2017    CA 8.7 11/05/2017    ALBUMIN 2.9 11/05/2017    LACTIC 1.9 08/04/2017    MG 1.7 11/03/2017    TOTBILI 0.2 11/05/2017    PO4 3.7 11/03/2017      Coagulation:   Lab Results   Component Value Date    PTT 29.6 08/26/2017    INR 1.0 11/03/2017         Anesthesia Plan    ASA score: 3   Plan: general  Induction method: intravenous  NPO status: acceptable      Informed Consent  Anesthetic plan and risks discussed with patient.  Use of blood products discussed with patient  Blood Consent: consented      Plan discussed with: anesthesiologist and CRNA.

## 2017-11-04 NOTE — Progress Notes
PHYSICAL THERAPY  ASSESSMENT     MOBILITY:  Mobility  Progressive Mobility Level: Active transfer to chair  Level of Assistance: Assist X1  Assistive Device: Wheelchair  Time Tolerated: 0-10 minutes  Activity Limited By: Pain    SUBJECTIVE:  Subjective  Significant hospital events: 36 y.o. male with past medical history of uncontrolled diabetes, COPD, hyperlipidemia, hypertension, depression, anxiety, multiple abscesses, MRSA, and sleep apnea who presented to the emergency department on 11/02/2017 with right lower extremity pain and wound with hematogenous and purulent drainage. He uses a wheelchair in his day-to-day life due to a history of a left below-knee amputation.    Mental / Cognitive Status: Alert;Oriented;Cooperative  Pain: Patient complains of pain;Patient does not rate pain  Pain Location: Right;Leg  Pain Description: Aching  Pain Interventions: Patient agrees to participate in therapy with modifications to session;Patient assisted into position of comfort  Comments: LLE BKA. Patient has prosthesis, but reports it does not fit anymore.   Ambulation Assist: Administrator, sports  Patient Owned Equipment: Media planner  Type of Home: Homeless  Comments: Pt reports he could likely d/c to a friends hotel room. He reports two falls in the last 3 months.     ROM:  ROM  ROM Position Assessed: Seated  ROM Method: Active  LE ROM: WFL    STRENGTH:  Strength  Strength Position Assessed: Seated  Strength Unable To Assess: Due to Pain/Surgery (RLE pain)    POSTURE/NEURO:  Posture / Neurological  Head Control: Independent  RLE Sensation/Proprioception: Impaired Light Touch    BED MOBILITY/TRANSFERS:  Bed Mobility/Transfers  Bed Mobility: Supine to Sit: Independent;Bed Flat;No Rail  Bed Mobility: Sit to Supine: Independent;Bed Flat;No Rail  Transfer Type: Squat Pivot  Transfer: Assistance Level: To/From;Bed;Wheelchair;Minimal Assist  Transfers: Type Of Assistance: Verbal Cues;For Safety Considerations End Of Activity Status: In Bed;Instructed Patient to Request Assist with Mobility;Instructed Patient to Use Call Light (bed alarm on)  Comments: Patient requests to transfer with his typical method which consists of placing LLE on the surface his is going to prior to sitting. He was provided with verbal cues and demonstrated a squat pivot transfer with wheelchair placed at bedside where he uses UEs on wheelchair armrests to squat pivot. The wheechair used by the patient has non-removable armrests and unsafe brakes. Wheelchair requires stabilization for safety during transfers.     BALANCE:  Balance  Sitting Balance: Dynamic Sitting Balance;2 UE Support;Standby Assist    WHEELCHAIR MOBILITY:  Wheelchair Mobility  Comments: Pt declines to perform wheelchair mobility at this time.     EDUCATION:  Education  Persons Educated: Patient  Patient Barriers To Learning: None Noted  Interventions: Demonstration Provided  Teaching Methods: Verbal Instruction;Demonstration  Patient Response: Verbalized Understanding;Return Demonstration;More Instruction Required  Topics: Plan/Goals of PT Interventions;Use of Assistive Device/Orthosis;Safety Awareness;Up with Assist Only;Equipment Recommendations;Recommend Continued Therapy    ASSESSMENT/PROGRESS:  Assessment/Progress  Impaired Mobility Due To: Pain;Safety Concerns  Assessment/Progress: Should Improve w/ Continued PT  Comments: Patient is likely near baseline level of mobility, but would benefit from continued therapy to progress functional mobility as able.   AM-PAC 6 Clicks Basic Mobility Inpatient  Turning from your back to your side while in a flat bed without using bed rails: None  Moving from lying on your back to sitting on the side of a flatbed without using bedrails : A Little  Moving to and from a bed to a chair (including a wheelchair): A Little  Standing up from a chair using your arms (  e.g. wheelchair, or bedside chair): Total  To walk in hospital room: Total Climbing 3-5 steps with a railing: Total  Raw Score: 13  Standardized (T-scale) Score: 33.99  Basic Mobility CMS 0-100%: 57.65  CMS G Code Modifier for Basic Mobility: CK    GOALS:  Goals  Goal Formulation: With Patient  Time For Goal Achievement: 5 days  Pt Will Transfer Bed/Chair: Independently  Pt Will Transfer Sit to Stand: w/ Stand By Assist  Pt Will Propel Wheelchair: >150 Feet, Independently    PLAN:  Plan   Treatment Interventions: Mobility Training;Wheelchair Assessment  Plan Frequency: 3-5 Days per Week  PT Plan for Next Visit: Progress independence with squat pivot transfers. Assess sit to stand with roller walker.     RECOMMENDATIONS:  PT Discharge Recommendations  PT Discharge Recommendations: Home with assistance   Equipment Recommendations: Patient owns necessary equipment (will continue to assess)  Comments: Patient would benefit from information on donated or reduced [facility] DME due to poor condition of current wheelchair.     Therapist: Marijo File, PT, DPT  Date: 11/04/2017

## 2017-11-04 NOTE — Progress Notes
Pt's wounds changed with dressings recommended by wound team.

## 2017-11-04 NOTE — Progress Notes
Pt reporting 10/10 pain in MRI. Pt reportedly hitting himself and inconsolable. Pt unable to hold still for MRI. Team paged, and one time dose fentanyl ordered to give. RN went to MRI and administered fentanyl. Will reassess pain.

## 2017-11-05 ENCOUNTER — Encounter: Admit: 2017-11-05 | Discharge: 2017-11-05 | Payer: MEDICAID

## 2017-11-05 DIAGNOSIS — E785 Hyperlipidemia, unspecified: ICD-10-CM

## 2017-11-05 DIAGNOSIS — I10 Essential (primary) hypertension: ICD-10-CM

## 2017-11-05 DIAGNOSIS — J449 Chronic obstructive pulmonary disease, unspecified: ICD-10-CM

## 2017-11-05 DIAGNOSIS — E119 Type 2 diabetes mellitus without complications: Principal | ICD-10-CM

## 2017-11-05 DIAGNOSIS — F99 Mental disorder, not otherwise specified: ICD-10-CM

## 2017-11-05 LAB — CBC AND DIFF: Lab: 5.6 K/UL — ABNORMAL LOW (ref >60)

## 2017-11-05 LAB — POC GLUCOSE
Lab: 166 mg/dL — ABNORMAL HIGH (ref 70–100)
Lab: 169 mg/dL — ABNORMAL HIGH (ref 70–100)
Lab: 173 mg/dL — ABNORMAL HIGH (ref 70–100)
Lab: 188 mg/dL — ABNORMAL HIGH (ref 70–100)
Lab: 221 mg/dL — ABNORMAL HIGH (ref 70–100)

## 2017-11-05 LAB — COMPREHENSIVE METABOLIC PANEL: Lab: 138 MMOL/L — ABNORMAL LOW (ref 137–147)

## 2017-11-05 MED ORDER — ONDANSETRON HCL (PF) 4 MG/2 ML IJ SOLN
INTRAVENOUS | 0 refills | Status: DC
Start: 2017-11-05 — End: 2017-11-05
  Administered 2017-11-05: 22:00:00 4 mg via INTRAVENOUS

## 2017-11-05 MED ORDER — MIDAZOLAM 1 MG/ML IJ SOLN
INTRAVENOUS | 0 refills | Status: DC
Start: 2017-11-05 — End: 2017-11-05
  Administered 2017-11-05: 21:00:00 2 mg via INTRAVENOUS

## 2017-11-05 MED ORDER — HYDROMORPHONE (PF) 2 MG/ML IJ SYRG
1 mg | INTRAVENOUS | 0 refills | Status: DC | PRN
Start: 2017-11-05 — End: 2017-11-05

## 2017-11-05 MED ORDER — HALOPERIDOL LACTATE 5 MG/ML IJ SOLN
1 mg | Freq: Once | INTRAVENOUS | 0 refills | Status: DC | PRN
Start: 2017-11-05 — End: 2017-11-05

## 2017-11-05 MED ORDER — LACTATED RINGERS IV SOLP
1000 mL | INTRAVENOUS | 0 refills | Status: DC
Start: 2017-11-05 — End: 2017-11-07
  Administered 2017-11-05: 20:00:00 1000 mL via INTRAVENOUS

## 2017-11-05 MED ORDER — FENTANYL CITRATE (PF) 50 MCG/ML IJ SOLN
50 ug | INTRAVENOUS | 0 refills | Status: DC | PRN
Start: 2017-11-05 — End: 2017-11-05
  Administered 2017-11-05: 22:00:00 50 ug via INTRAVENOUS

## 2017-11-05 MED ORDER — DEXTRAN 70-HYPROMELLOSE (PF) 0.1-0.3 % OP DPET
0 refills | Status: DC
Start: 2017-11-05 — End: 2017-11-05
  Administered 2017-11-05: 21:00:00 2 [drp] via OPHTHALMIC

## 2017-11-05 MED ORDER — PROPOFOL INJ 10 MG/ML IV VIAL
0 refills | Status: DC
Start: 2017-11-05 — End: 2017-11-05
  Administered 2017-11-05: 21:00:00 200 mg via INTRAVENOUS

## 2017-11-05 MED ORDER — SUCCINYLCHOLINE CHLORIDE 20 MG/ML IJ SOLN
INTRAVENOUS | 0 refills | Status: DC
Start: 2017-11-05 — End: 2017-11-05
  Administered 2017-11-05: 21:00:00 100 mg via INTRAVENOUS

## 2017-11-05 MED ORDER — INSULIN GLARGINE 100 UNIT/ML (3 ML) SC INJ PEN
40 [IU] | Freq: Every evening | SUBCUTANEOUS | 0 refills | Status: DC
Start: 2017-11-05 — End: 2017-11-07

## 2017-11-05 MED ORDER — FENTANYL CITRATE (PF) 50 MCG/ML IJ SOLN
0 refills | Status: DC
Start: 2017-11-05 — End: 2017-11-05
  Administered 2017-11-05: 21:00:00 100 ug via INTRAVENOUS

## 2017-11-05 MED ORDER — MORPHINE 4 MG/ML IV CRTG
4 mg | Freq: Once | INTRAVENOUS | 0 refills | Status: CP
Start: 2017-11-05 — End: ?
  Administered 2017-11-06: 06:00:00 4 mg via INTRAVENOUS

## 2017-11-05 MED ORDER — SENNOSIDES-DOCUSATE SODIUM 8.6-50 MG PO TAB
1 | Freq: Two times a day (BID) | ORAL | 0 refills | Status: DC
Start: 2017-11-05 — End: 2017-11-06
  Administered 2017-11-05 – 2017-11-06 (×2): 1 via ORAL

## 2017-11-05 MED ORDER — OXYCODONE 5 MG PO TAB
5-10 mg | Freq: Once | ORAL | 0 refills | Status: CP | PRN
Start: 2017-11-05 — End: ?
  Administered 2017-11-05: 22:00:00 10 mg via ORAL

## 2017-11-05 MED ORDER — PHENYLEPHRINE IN 0.9% NACL(PF) 1 MG/10 ML (100 MCG/ML) IV SYRG
INTRAVENOUS | 0 refills | Status: DC
Start: 2017-11-05 — End: 2017-11-05
  Administered 2017-11-05 (×3): 100 ug via INTRAVENOUS

## 2017-11-05 MED ORDER — FENTANYL CITRATE (PF) 50 MCG/ML IJ SOLN
25-50 ug | INTRAVENOUS | 0 refills | Status: DC | PRN
Start: 2017-11-05 — End: 2017-11-05

## 2017-11-05 MED ORDER — MEPERIDINE (PF) 25 MG/ML IJ SYRG
12.5 mg | INTRAVENOUS | 0 refills | Status: DC | PRN
Start: 2017-11-05 — End: 2017-11-05

## 2017-11-05 MED ORDER — PROMETHAZINE 25 MG/ML IJ SOLN
6.25 mg | INTRAVENOUS | 0 refills | Status: DC | PRN
Start: 2017-11-05 — End: 2017-11-05

## 2017-11-05 MED ORDER — LIDOCAINE (PF) 10 MG/ML (1 %) IJ SOLN
.1-2 mL | INTRAMUSCULAR | 0 refills | Status: DC | PRN
Start: 2017-11-05 — End: 2017-11-05

## 2017-11-05 MED ORDER — LIDOCAINE (PF) 200 MG/10 ML (2 %) IJ SYRG
0 refills | Status: DC
Start: 2017-11-05 — End: 2017-11-05
  Administered 2017-11-05: 21:00:00 100 mg via INTRAVENOUS

## 2017-11-05 MED ORDER — ONDANSETRON HCL (PF) 4 MG/2 ML IJ SOLN
4 mg | Freq: Once | INTRAVENOUS | 0 refills | Status: DC | PRN
Start: 2017-11-05 — End: 2017-11-05

## 2017-11-05 NOTE — Progress Notes
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    RLE wound  - ESR = 73  - no ostial destruction on the plain films   - MRI w/ soft tissue ulcer, no evidence of osteomyelitis c/w cellulitis possible myositis   - ortho consulted - plan for I&D and likely wound vac today  - ID consulted  - cont Vanc.  Zosyn dc'd  - cont current pain control, but will need to be titrated prior to discharge d/t lack of outside prescriber   - wound GS w/ GPC resembling staph, GPC resembling strep  - cx growing MRSA  - 1/4 SIRS w/ tachycardia only  - blood cx NGTD    H/o multiple MRSA wounds  - cont CHG baths, mupirocin to nares    Uncontrolled DM2  - compliance c/b lack of insurance  - cont aspart 15u TID AC  - increase lantus to 40u QHS  - MD[facility]  - diabetes RN consulted    FEN: no IVF, replace lytes PRN, ADA diet  Ppx: lovenox  Code: Full    Dispo: cont inpt admit, cont Vanc.  Going to OR today for I&D.    ________________________________________________________________________    Subjective  Bryan Bell is a 36 y.o. male.  Patient reports ongoing RLE pain, but not any worse today.  Has not had a bowel movement in 3 days, is passing gas.  Having N/V today, feels it is from being NPO    ROS: neg for fevers, chills, dyspnea, chest pain, palpitations Medications  Scheduled Meds:    enoxaparin (LOVENOX) syringe 40 mg 40 mg Subcutaneous QDAY(21)   insulin aspart U-100 (NOVOLOG FLEXPEN) injection PEN 0-7 Units 0-7 Units Subcutaneous ACHS   insulin aspart U-100 (NOVOLOG FLEXPEN) injection PEN 15 Units 15 Units Subcutaneous TID w/ meals   insulin glargine (LANTUS SOLOSTAR, BASAGLAR) injection PEN 40 Units 40 Units Subcutaneous QHS(22)   mupirocin (BACTROBAN) 2 % topical ointment  Topical BID   polyethylene glycol 3350 (MIRALAX) packet 17 g 1 packet Oral QDAY   senna/docusate (SENOKOT-S) tablet 1 tablet 1 tablet Oral BID   vancomycin (VANCOCIN) 2 g in sodium chloride 0.9% (NS) IVPB 2 g Intravenous Q12H*   Continuous Infusions:    PRN and Respiratory Meds:acetaminophen Q6H PRN, HYDROcodone/acetaminophen Q4H PRN, ondansetron (ZOFRAN) IV Q6H PRN, traZODone QHS PRN, vancomycin   IVPB Q12H* **AND** vancomycin, pharmacy to manage Per Pharmacy, vitamin A & D PRN      Objective                       Vital Signs: Last Filed  Vital Signs: 24 Hour Range   BP: 143/79 (11/16 0738)  Temp: 36.7 ???C (98 ???F) (11/16 1610)  Pulse: 84 (11/16 0738)  Respirations: 16 PER MINUTE (11/16 0738)  SpO2: 100 % (11/16 0738)  O2 Delivery: None (Room Air) (11/16 0738) BP: (137-148)/(77-79)   Temp:  [36.7 ???C (98 ???F)-37 ???C (98.6 ???F)]   Pulse:  [84-88]   Respirations:  [16 PER MINUTE-18 PER MINUTE]   SpO2:  [95 %-100 %]   O2 Delivery: None (Room Air)   Intensity Pain Scale (Self Report): Asleep (11/04/17 2239) Vitals:    11/02/17 1743 11/03/17 0000   Weight: 99.8 kg (220 lb) 90.5 kg (199 lb 9.6 oz)       Intake/Output Summary:  (Last 24 hours)    Intake/Output Summary (Last 24 hours) at 11/05/17 0945  Last data filed at 11/05/17 0755   Gross per 24 hour   Intake              600 ml   Output             5550 ml   Net            -4950 ml           Physical Exam  General:  Awake, alert, currently retching   Head:  Normocephalic, without obvious abnormality, atraumatic. Eyes:  Conjunctivae/corneas clear.    Lungs:  CTAB, no rales, no wheezing.  Heart:  Tachy, reg rhythm, no MRG.  Abdomen:  Soft, non-tender.  Bowel sounds normal.    Extremities:  S/p L BKA, RLE w/ eschar and ~2.5x2.5cm open wound on the lateral aspect of the mid leg.  No drainage present.  Pulses:   2+ radial pulses b/l  Skin:   Multiple prior scars on his b/l UE    Lab Review  Hematology:    Lab Results   Component Value Date    HGB 10.3 11/05/2017    HCT 30.7 11/05/2017    PLTCT 267 11/05/2017    WBC 5.6 11/05/2017    NEUT 49 11/05/2017    ANC 2.80 11/05/2017    ALC 2.30 11/05/2017    MONA 6 11/05/2017    AMC 0.40 11/05/2017    ABC 0.00 11/05/2017    MCV 83.0 11/05/2017    MCHC 33.4 11/05/2017    MPV 7.7 11/05/2017    RDW 14.9 11/05/2017   , Coagulation:    Lab Results   Component Value Date    PTT 29.6 08/26/2017    INR 1.0 11/03/2017   , General Chemistry:    Lab Results   Component Value Date    NA 138 11/05/2017    K 3.9 11/05/2017    CL 105 11/05/2017    GAP 7 11/05/2017    BUN 9 11/05/2017    CR 0.63 11/05/2017    GLU 287 11/05/2017    CA 8.7 11/05/2017    ALBUMIN 2.9 11/05/2017    LACTIC 1.9 08/04/2017    MG 1.7 11/03/2017    TOTBILI 0.2 11/05/2017    and Enzymes:    Lab Results   Component Value Date    AST 11 11/05/2017    ALT 10 11/05/2017    ALKPHOS 83 11/05/2017       Point of Care Testing  (Last 24 hours)  Glucose: (!) 287 (11/05/17 0443)  POC Glucose (Download): (!) 221 (11/05/17 9604)    Radiology and other Diagnostics Review:    No radiology, micro reviewed.  Reona Zendejas, MD   Pager [phone]

## 2017-11-05 NOTE — Progress Notes
Infectious Diseases Progress Note    11/05/2017    Admission Date: 11/02/2017  LOS: 3 days                     IMPRESSION:  1. Recurrent MRSA abscess. 11/02/17 with lateral right lower leg abscess.   2. psychosocioeconomic circumstance  3. Diabetes mellitus type 1  4. H/o left BKA  5. Pruritis and body scratching  6. anemia  ???  ???  ???  ???  PLAN:  1. Cont mupirocin to nares, Use CHG bathing  2. Await surgery and cultures  3. Cont vanc. Will stop zosyn for now. Will adjust abx as needed as cultures finalized.   4. Monitor for drug adverse reaction/toxicity  5. Refuses influenza vaccine.   6. Will follow    ________________________________________________________________________    Interval history:  Bryan Bell is a 36 y.o. male.  Patient afebrile. Plan for OR today. No new problems. Cr good. No f/c/ns, cough, SOB, abd pain. +RLE pain. Resting. Eschar increased today.           ABX:  Vanc, zosyn     Medications  Scheduled Meds:    enoxaparin (LOVENOX) syringe 40 mg 40 mg Subcutaneous QDAY(21)   insulin aspart U-100 (NOVOLOG FLEXPEN) injection PEN 0-7 Units 0-7 Units Subcutaneous ACHS   insulin aspart U-100 (NOVOLOG FLEXPEN) injection PEN 15 Units 15 Units Subcutaneous TID w/ meals   insulin glargine (LANTUS SOLOSTAR, BASAGLAR) injection PEN 40 Units 40 Units Subcutaneous QHS(22)   mupirocin (BACTROBAN) 2 % topical ointment  Topical BID   piperacillin/tazobactam  (ZOSYN) 3.375 g/50 mL iso-osmotic IVPB 3.375 g Intravenous Q6H*   polyethylene glycol 3350 (MIRALAX) packet 17 g 1 packet Oral QDAY   vancomycin (VANCOCIN) 2 g in sodium chloride 0.9% (NS) IVPB 2 g Intravenous Q12H*   Continuous Infusions:    PRN and Respiratory Meds:acetaminophen Q6H PRN, HYDROcodone/acetaminophen Q4H PRN, ondansetron (ZOFRAN) IV Q6H PRN, traZODone QHS PRN, vancomycin   IVPB Q12H* **AND** vancomycin, pharmacy to manage Per Pharmacy, vitamin A & D PRN      Objective Vital Signs: Last Filed                 Vital Signs: 24 Hour Range   BP: 143/79 (11/16 0738)  Temp: 36.7 ???C (98 ???F) (11/16 1610)  Pulse: 84 (11/16 0738)  Respirations: 16 PER MINUTE (11/16 0738)  SpO2: 100 % (11/16 0738)  O2 Delivery: None (Room Air) (11/16 0738) BP: (137-148)/(77-79)   Temp:  [36.7 ???C (98 ???F)-37 ???C (98.6 ???F)]   Pulse:  [84-88]   Respirations:  [16 PER MINUTE-18 PER MINUTE]   SpO2:  [95 %-100 %]   O2 Delivery: None (Room Air)   Intensity Pain Scale (Self Report): Asleep (11/04/17 2239) Vitals:    11/02/17 1743 11/03/17 0000   Weight: 99.8 kg (220 lb) 90.5 kg (199 lb 9.6 oz)           Physical Exam  Gen: resting, arousable, no acute distress  HEENT:  EOMI, no subconjunctival hemorrhages  CV: RRR  Resp: Clear bilat, air exchange good   Abd, soft, non tender, +BS  Extremity: L BKA, RLE with lateral wound with eschar, mild surrounding erythema with purulent base, +ttp  Derm: no rash/lesions appreciated          Lab Review  Full labs reviewed.     Recent Labs      11/05/17   0443   WBC  5.6  Radiology and other Diagnostics Review: Radiology viewed and reports reviewed    11/04/17 MRI:  IMPRESSION  1. ???Soft tissue ulceration overlying the lateral head of the fibula   without evidence of osteomyelitis.    2. ???Skin thickening, edema and enhancement of the subcutaneous soft   tissues of the leg, nonspecific, but can be seen in setting of cellulitis.   No drainable soft tissue fluid collection is identified.    3. ???Mild edema and enhancement within the multiple proximal leg muscles,   suggestive of myositis, as described above. No drainable intramuscular   fluid collection.    I have interviewed and examined this patient today and viewed the vital signs, laboratory, microbiology and radiology.    Tobi Bastos, MD      Tobi Bastos, MD  Infectious Diseases Faculty  Pager: [phone]

## 2017-11-05 NOTE — Anesthesia Post-Procedure Evaluation
Post-Anesthesia Evaluation    Name: Bryan Bell      MRN: [id number]     DOB: 11-10-1981     Age: 36 y.o.     Sex: male   __________________________________________________________________________     Procedure Date: 11/05/2017  Procedure: Procedure(s) with comments:  DEBRIDEMENT WOUND DEEP 20 SQ CM OR LESS - LOWER EXTREMITY - CASE LENGTH 2 HOURS      Surgeon: Surgeon(s):  Heddings, Revonda StandardArchie A, MD  Geni BersBachman, Kate, PA-C    Post-Anesthesia Vitals  BP: 102/65 (11/16 1630)  Temp: 36.5 C (97.7 F) (11/16 1553)  Pulse: 78 (11/16 1630)  Respirations: 18 PER MINUTE (11/16 1630)  SpO2: 100 % (11/16 1630)  O2 Delivery: Nasal Cannula (11/16 1630)  SpO2 Pulse: 78 (11/16 1630)      Post Anesthesia Evaluation Note    Evaluation location: Pre/Post  Patient participation: recovered; patient participated in evaluation  Level of consciousness: alert    Pain score: 0  Pain management: adequate    Hydration: normovolemia  Temperature: 36.0C - 38.4C  Airway patency: adequate    Perioperative Events  Perioperative events:  no       Post-op nausea and vomiting: no PONV    Postoperative Status  Cardiovascular status: hemodynamically stable  Respiratory status: spontaneous ventilation  Follow-up needed: none        Perioperative Events  Perioperative Event: No  Emergency Case Activation: No

## 2017-11-05 NOTE — Case Management (ED)
Case Management Progress Note    NAME:Bryan Bell                          MRN: [id number]              DOB:10/03/1981          AGE: 36 y.o.  ADMISSION DATE: 11/02/2017             DAYS ADMITTED: LOS: 3 days      Today???s Date: 11/05/2017    Plan  Pt to d/c to homeless shelter when medically stable      Interventions  ? Support      ? Info or Referral      ? Discharge Planning    NCM spoke with patient/family about Discharge Equipment needs on 11-15  NCm provided ABx infusion assistance application to be filled out.  Ncm asked it be filled out by 11-16.     11-16  NCm stopped by room and form was not filled out.  Pt went down for I&D and poss W/v placement  Pt will not be able to d/c with W/v as homeless and likely to never recover w/v.  Ncm made multiple attempts to meet with patient during rounds and after conf but pt has been in OR through now  NCm will attempt once more before end of day.  NCm will see if pt will let NCm fill out the app if he gives the information.  Likley pt wont d/c until next week.  ID is looking at oral vs IV Abx and trying for Doxy vs Zyvox.    SW is following with med pricing  IV Abx assist is likely not needed.  NCm to continue to follow for D/c needs          ? Medication Needs   Medication Needs: Other  ? Financial   Financial: Mining engineer, Financial Counseling Referral/Follow-up  ? Legal      ? Other        Disposition  ? Expected Discharge Date    Expected Discharge Date: 11/06/17  ? Transportation   Does the patient need discharge transport arranged?: Yes  Transportation Name, Phone and Availability #1: bus pass  Does the patient use Medicaid Transportation?: No  ? Next Level of Care (Acute Psych discharges only)      ? Discharge Disposition                                          Durable Medical Equipment     No service has been selected for the patient.      [facility]     No service has been selected for the patient.      [facility] No service has been selected for the patient.      [facility]     No service has been selected for the patient.        Zollie Beckers RN-NCM  Case Management Department  [phone]  [phone]

## 2017-11-05 NOTE — Progress Notes
Ortho Post-Op Recs     AB: continue vanc per ID   WB Status: WBAT   Dressing:Wound vac on. Will have wound care do dressing changes.   DVT Prophylaxis: Mechanical, Chemoprophylaxis: per primary   Will continue to follow    Geni BersKate Randalyn Ahmed PA-C  Pager [phone] (available [facility])

## 2017-11-05 NOTE — Consults
[facility] Consult Note      Consult Date: 11/02/2017                                                  Chief Complaint/Reason for Consult: Right lower extremity wound with concern for deep tissue infection.    Assessment/Plan   1.  Right lower extremity wound/abscess with surrounding erythema and purulent drainage.     Discussed with patient's findings on his MRI.  At this time there does not appear to any osteomyelitis of his fibular head.  While this is reassuring I feel given the appearance of the wound and positive cultures it is only a matter of time before this could migrate deep and cause deep infection.  For this reason we will recommend surgical debridement in the OR tomorrow.  Discussed with the patient that based on the size of his wound this could mean he needs a wound VAC placed following surgery.  He could also need consultation from our plastic surgery department for future coverage if we are unable to get the wound closed.  I did discuss with the patient that should this wound not heal or turn into a deep bony infection that there is a risk of loss of life or above-knee amputation.  He understands the risk factors and consent is signed for surgery.    LLE: Recommend offloading with a pillow to prevent pressure sore to the end of his stump.     Orthopedic Intervention - To OR 11/16  WB Status -weightbearing as tolerated right lower extremity  Antibiotics / Tetanus -infectious disease on board.  Appreciate their recommendations  Pain Control per primary  NPO p MN  PT/OT  DVT PPX - Mechanical, Chemoprophylaxis per primary    Plan was discussed with Dr Cherene Julian.   ______________________________________________________________________    History of Present Illness: Bryan Bell is a 36 y.o. male with past medical history of uncontrolled diabetes, COPD, hyperlipidemia, hypertension, depression, anxiety, multiple abscesses, MRSA, and sleep apnea who presented to the emergency department on 11/02/2017 with right lower extremity pain and wound with hematogenous and purulent drainage. He uses a wheelchair in his day-to-day life due to a history of a left below-knee amputation.  He has uncontrolled diabetes with a low his latest hemoglobin A1c in October 2018 of 12.9. He first noticed the right leg wound 2-3 weeks ago and it has been worsening since that time over the past few days.  He has noticed erythema around the wound and it is been draining blood and purulent material.  He denies any fevers or chills.  He cannot bear weight on his right lower extremity without the onset of pain.  He has a history of multiple skin abscesses due to his diabetes and to the fact that he picks at his skin as a symptom of his anxiety.    Past Medical History:   Diagnosis Date   ??? COPD (chronic obstructive pulmonary disease) (HCC)    ??? 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 Hyperlipidemia    ??? Hypertension    ??? Psychiatric illness     bipolar, depression, anxiety,        Past Surgical History:   Procedure Laterality Date   ??? BELOW KNEE AMPUTATION Left  Social History  Current tobacco use.  1-2 pack per day.  18-pack-year history.  Denies current illicit drug use i can't afford it.  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  Rare alcohol use    Family history  Family history positive for coronary artery disease, stroke, diabetes, hypertension, hyperlipidemia, and cancer.   Family history is otherwise noncontributory for presenting problem    Allergies:  Patient has no known allergies.    Outpatient Prescriptions as of 11/04/2017   Medication Sig Dispense Refill   ??? atorvastatin (LIPITOR) 20 mg tablet Take 20 mg by mouth daily.     ??? blood sugar diagnostic (GLUCOCARD EXPRESSION) test strip Use one strip as directed before meals and at bedtime. 300 strip 3   ??? Blood-Glucose Meter (GLUCOCARD EXPRESSION) kit Use as directed 1 kit 0 ??? buPROPion XL (WELLBUTRIN XL) 150 mg tablet Take 150 mg by mouth every morning. Do not crush or chew.     ??? insulin glargine (LANTUS SOLOSTAR, BASAGLAR) 100 unit/mL (3 mL) injection PEN Inject 90 Units under the skin at bedtime daily.     ??? insulin lispro(+) (HUMALOG U-100 INSULIN) 100 unit/mL injection Inject 10 units under the skin three times daily before meals. (Patient taking differently: Inject 30-40 Units under the skin three times daily before meals.) 10 vial 1   ??? insulin pen needles (disposable) (BD UF NANO PEN NEEDLES) 32 gauge x 5/32 pen needle Use one each as directed as Needed. Use with insulin injections. 300 each 3   ??? lancets 30 guage 30 gauge Use 1 each as directed four times daily as needed. 300 each 11   ??? lisinopril (PRINIVIL; ZESTRIL) 10 mg tablet Take 10 mg by mouth daily.     ??? ranitidine(+) (ZANTAC) 150 mg tablet Take 150 mg by mouth twice daily.           Review of Systems:  Pertinent positive findings: Right lower extremity pain around wound.  Pertinent negative findings: Denies fevers or chills, headache, or pain in his other extremities.  Denies any right knee pain, or effusion.  Otherwise a 10 point review of systems was negative    Vital Signs:  Last Filed in 24 hours   BP: 137/79 (11/15 1628)  Temp: 36.9 ???C (98.4 ???F) (11/15 1628)  Pulse: 88 (11/15 1628)  Respirations: 18 PER MINUTE (11/15 1628)  SpO2: 99 % (11/15 1628)  O2 Delivery: None (Room Air) (11/15 2956)     Physical Exam:    Constitutional: AAOx3, NAD  Head: normocephalic, atraumatic  Eyes: EOMI, PERRLA  ENT: Oropharynx/Nasopharynx clear  Respiratory: Unlabored respirations, equal chest rise bilaterally  Cardiovascular: Regular rate and rhythm  Gastrointestinal: Soft, non-tender. No rebound tenderness  Skin: Multiple abscesses in bilateral upper extremities and his forearms.  Left below-knee amputation with well-healed surgical incision.  2 small superficial wounds on the lateral aspect of his left stump.  Likely positional from pressure  Right lower extremity: Right lower extremity wound measuring 2 cm x 3 cm with a black eschar superficially.  There is a small area that is open and seems to be draining purulent material.  The wound is mildly tender to palpation with erythema in the surrounding margins.  2 small superficial wounds to his right posterior calf which did not seem to be involved with his larger wound.  Vascular: 2+ pulses to bilateral upper and lower extremities  Musculoskeletal: Right lower extremity: Painless active and passive range of motion of the right knee.  Right knee without effusion or tenderness to palpation.  Palpable dorsalis pedis and posterior tibial artery on the right.  5 out of 5 strength in all muscle groups of the right lower extremity.  Left lower extremity: Left below-knee amputation stump is well-healed incision.  Skin as noted above.  Full active and passive range of motion without pain of the left knee.  Other extremities demonstrate no deficits with full active and passive painless range of motion.  Neurologic: Loss of sensation in his bilateral lower extremities from the knees distal.  No other focal neurologic deficits.  Cranial nerves II through XII grossly intact.  Psychiatric: Appropriate mood and bahvior    Lab/Radiology/Other Diagnostic Tests:    Radiology: Plain film and MRI reviewed.  Right tibia and fibula films demonstrate no osseous abnormality including osteomyelitis.  There is mild edema in the right lower extremity.  No defined fluid collection      CBC w/Diff   Lab Results   Component Value Date/Time    WBC 6.9 11/04/2017 05:08 AM    HGB 9.0 (L) 11/04/2017 05:08 AM    HCT 26.2 (L) 11/04/2017 05:08 AM    PLTCT 222 11/04/2017 05:08 AM           Basic Metabolic Profile   Lab Results   Component Value Date/Time    NA 135 (L) 11/04/2017 05:08 AM    K 3.6 11/04/2017 05:08 AM    CL 105 11/04/2017 05:08 AM    CO2 26 11/04/2017 05:08 AM    GAP 4 11/04/2017 05:08 AM BUN 7 11/04/2017 05:08 AM    CR 0.69 11/04/2017 05:08 AM    GLU 374 (H) 11/04/2017 05:08 AM        Coagulation Studies   Lab Results   Component Value Date/Time    PTT 29.6 08/26/2017 12:02 AM    INR 1.0 11/03/2017 04:47 AM            Orlan Leavens, APRN  [phone] (Tue/Thurs)

## 2017-11-05 NOTE — Progress Notes
To OR today for RLE I&D possible wound vac placement.     Keep NPO.     Geni BersKate Lilibeth Opie PA-C  Pager [phone] (available [facility])

## 2017-11-05 NOTE — Other
Brief Operative Note    Name: Ernesta AmbleGarrett Lee Canter is a 36 y.o. male     DOB: 05/10/1981             MRN#: [id number]  DATE OF OPERATION: 11/05/2017    Date:  11/05/2017        Preoperative Dx:   Open wound [T14.8XXA]    Post-op Diagnosis      * Open wound [T14.8XXA]    Procedure(s) (LRB):  DEBRIDEMENT WOUND DEEP 20 SQ CM OR LESS - LOWER EXTREMITY (Right)    Anesthesia Type: General    Surgeon(s) and Role:     * Heddings, Revonda StandardArchie A, MD - Primary     * Geni BersBachman, Griffin Dewilde, PA-C - Assisting      Findings:  necrotic wound    Estimated Blood Loss: 10ml    Specimen(s) Removed/Disposition:   ID Type Source Tests Collected by Time Destination   A : TISSUE RIGHT LEG Tissue Leg CULTURE-ANAEROBIC, CULTURE-WOUND/TISSUE/FLUID(AEROBIC ONLY)W/SENSITIVITY, CULTURE-TB (AFB), GRAM STAIN, CULTURE-FUNGAL,OTHER Heddings, Archie A, MD 11/05/2017 1536        Complications:  None    Implants: None    Drains: None    Disposition:  PACU - stable    Geni BersKate Cambrea Kirt, PA-C  Pager [phone]

## 2017-11-06 LAB — CULTURE-WOUND/TISSUE/FLUID(AEROBIC ONLY)W/SENSITIVITY

## 2017-11-06 LAB — GRAM STAIN

## 2017-11-06 LAB — POC GLUCOSE
Lab: 305 mg/dL — ABNORMAL HIGH (ref 70–100)
Lab: 338 mg/dL — ABNORMAL HIGH (ref 70–100)
Lab: 222 mg/dL — ABNORMAL HIGH (ref 70–100)
Lab: 75 mg/dL (ref 70–100)

## 2017-11-06 LAB — VANCOMYCIN TROUGH: Lab: 13 ug/mL (ref 10.0–20.0)

## 2017-11-06 LAB — CBC AND DIFF: Lab: 8 K/UL — ABNORMAL HIGH (ref 4.5–11.0)

## 2017-11-06 LAB — COMPREHENSIVE METABOLIC PANEL: Lab: 140 MMOL/L — ABNORMAL LOW (ref >60)

## 2017-11-06 MED ORDER — MORPHINE 4 MG/ML IV CRTG
4-6 mg | INTRAVENOUS | 0 refills | Status: DC | PRN
Start: 2017-11-06 — End: 2017-11-09
  Administered 2017-11-06 – 2017-11-08 (×9): 4 mg via INTRAVENOUS
  Administered 2017-11-08: 18:00:00 2 mg via INTRAVENOUS
  Administered 2017-11-09 (×2): 4 mg via INTRAVENOUS

## 2017-11-06 MED ORDER — SENNOSIDES-DOCUSATE SODIUM 8.6-50 MG PO TAB
2 | Freq: Two times a day (BID) | ORAL | 0 refills | Status: DC
Start: 2017-11-06 — End: 2017-11-20
  Administered 2017-11-07 (×2): 2 via ORAL

## 2017-11-06 MED ORDER — POLYETHYLENE GLYCOL 3350 17 GRAM PO PWPK
1 | Freq: Two times a day (BID) | ORAL | 0 refills | Status: DC
Start: 2017-11-06 — End: 2017-11-20
  Administered 2017-11-07 (×2): 17 g via ORAL

## 2017-11-06 NOTE — Progress Notes
Pharmacy Vancomycin Note  Subjective:   Bryan Bell is a 36 y.o. male being treated for Sepsis, wound infections.    Objective:     Current Vancomycin Orders   Medication Dose Route Frequency    vancomycin (VANCOCIN) 2 g in sodium chloride 0.9% (NS) IVPB  2 g Intravenous Q12H*    And    vancomycin, pharmacy to manage  1 each Service Per Pharmacy     Start Date of  Vancomycin therapy: 11/02/2017  Cultures: MRSA RLE abscess swab  White Blood Cells   Date/Time Value Ref Range Status   11/06/2017 0401 8.0 4.5 - 11.0 K/UL Final   11/05/2017 0443 5.6 4.5 - 11.0 K/UL Final   11/04/2017 0508 6.9 4.5 - 11.0 K/UL Final     Creatinine   Date/Time Value Ref Range Status   11/06/2017 0401 0.70 0.4 - 1.24 MG/DL Final   13/24/401011/16/2018 [id number] 0.63 0.4 - 1.24 MG/DL Final   36/64/403411/15/2018 [id number] 0.69 0.4 - 1.24 MG/DL Final     Estimated CrCl: 188 mL/min  Actual Weight:  90.5 kg (199 lb 9.6 oz)    Drug Levels:  Vancomycin Trough   Date/Time Value Ref Range Status   11/06/2017 0830 13.9 10.0 - 20.0 MCG/ML Final     Assessment:   Target levels for this patient: ~15.  Evaluation of level(s): Level drawn appropriately and therapeutic    Plan:   1. Will continue current vanc regimen and plan for a repeat level in a few days or sooner if clinical status changes.   2. Pharmacy will continue to monitor and adjust therapy as needed.    Conan Bowensiffany Trumbo, [facility] Surgery CenterHARMD  11/06/2017

## 2017-11-06 NOTE — Anesthesia Pain Rounding
Anesthesia Follow-Up Evaluation: Post-Procedure Day One    Name: Bryan Bell     MRN: [id number]     DOB: 08/31/1981     Age: 36 y.o.     Sex: male   __________________________________________________________________________     Procedure Date: 11/05/2017   Procedure: Procedure(s) with comments:  DEBRIDEMENT WOUND DEEP 20 SQ CM OR LESS - LOWER EXTREMITY - CASE LENGTH 2 HOURS    Physical Assessment  Height: 182.9 cm (72)  Weight: 90.5 kg (199 lb 9.6 oz)    Vital Signs (Last Filed in 24 hours)  BP: 138/80 (11/17 0745)  Temp: 36.6 ???C (97.9 ???F) (11/17 0745)  Pulse: 82 (11/17 0745)  Respirations: 16 PER MINUTE (11/17 0745)  SpO2: 100 % (11/17 0745)  O2 Delivery: None (Room Air) (11/17 0745)  SpO2 Pulse: 79 (11/16 1645)    Patient History   Allergies  No Known Allergies     Medications  Scheduled Meds:  enoxaparin (LOVENOX) syringe 40 mg 40 mg Subcutaneous QDAY(21)   insulin aspart U-100 (NOVOLOG FLEXPEN) injection PEN 0-7 Units 0-7 Units Subcutaneous ACHS   insulin aspart U-100 (NOVOLOG FLEXPEN) injection PEN 15 Units 15 Units Subcutaneous TID w/ meals   insulin glargine (LANTUS SOLOSTAR, BASAGLAR) injection PEN 40 Units 40 Units Subcutaneous QHS(22)   mupirocin (BACTROBAN) 2 % topical ointment  Topical BID   polyethylene glycol 3350 (MIRALAX) packet 17 g 1 packet Oral QDAY   senna/docusate (SENOKOT-S) tablet 1 tablet 1 tablet Oral BID   vancomycin (VANCOCIN) 2 g in sodium chloride 0.9% (NS) IVPB 2 g Intravenous Q12H*   Continuous Infusions:  ??? lactated ringers infusion 1,000 mL (11/05/17 1400)     PRN and Respiratory Meds:acetaminophen Q6H PRN, HYDROcodone/acetaminophen Q4H PRN, ondansetron (ZOFRAN) IV Q6H PRN, traZODone QHS PRN, vancomycin   IVPB Q12H* **AND** vancomycin, pharmacy to manage Per Pharmacy, vitamin A & D PRN      Diagnostic Tests  Hematology: Lab Results   Component Value Date    HGB 10.3 11/06/2017    HCT 31.4 11/06/2017    PLTCT 308 11/06/2017    WBC 8.0 11/06/2017    NEUT 60 11/06/2017 ANC 4.90 11/06/2017    ALC 2.60 11/06/2017    MONA 5 11/06/2017    AMC 0.40 11/06/2017    EOSA 2 11/06/2017    ABC 0.00 11/06/2017    MCV 83.2 11/06/2017    MCH 27.4 11/06/2017    MCHC 32.9 11/06/2017    MPV 7.8 11/06/2017    RDW 14.7 11/06/2017         General Chemistry: Lab Results   Component Value Date    NA 140 11/06/2017    K 3.8 11/06/2017    CL 105 11/06/2017    CO2 27 11/06/2017    GAP 8 11/06/2017    BUN 8 11/06/2017    CR 0.70 11/06/2017    GLU 297 11/06/2017    CA 8.6 11/06/2017    ALBUMIN 2.9 11/06/2017    LACTIC 1.9 08/04/2017    MG 1.7 11/03/2017    TOTBILI 0.2 11/06/2017    PO4 3.7 11/03/2017      Coagulation:   Lab Results   Component Value Date    PTT 29.6 08/26/2017    INR 1.0 11/03/2017         Follow-Up Assessment  Patient location during evaluation: floor      Anesthetic Complications:   Anesthetic complications: The patient did not experience any anesthestic complications.  Pain:  Score: 0    Management:adequate     Level of Consciousness: awake and alert   Hydration:acceptable     Airway Patency: patent   Respiratory Status: acceptable, spontaneous ventilation and room air     Cardiovascular Status:acceptable and hemodynamically stable   Regional/Neuroaxial:

## 2017-11-06 NOTE — Progress Notes
PHYSICAL THERAPY  PROGRESS NOTE       MOBILITY:  Mobility  Progressive Mobility Level: Active transfer to chair  Level of Assistance: Stand by assistance  Assistive Device: Wheelchair  Time Tolerated: 0-10 minutes  Activity Limited By: Pain    SUBJECTIVE:  Subjective  Significant hospital events: 36 y.o. male with past medical history of uncontrolled diabetes, COPD, hyperlipidemia, hypertension, depression, anxiety, multiple abscesses, MRSA, and sleep apnea who presented to the emergency department on 11/02/2017 with right lower extremity pain and wound with hematogenous and purulent drainage. He uses a wheelchair in his day-to-day life due to a history of a left below-knee amputation.    Mental / Cognitive Status: Alert;Oriented;Cooperative  Pain: Patient complains of pain;Patient does not rate pain  Pain Location: Right;Leg  Pain Description: Aching  Pain Interventions: Patient agrees to participate in therapy with modifications to session;Patient assisted into position of comfort  Comments: LLE BKA, reports prothesis does not fit anymore.    R LE Precautions: RLE WBAT: Weight Bearing as Tolerated (wound VAC)  Type of Home: Homeless    BED MOBILITY/TRANSFERS:  Bed Mobility/Transfers  Bed Mobility: Supine to Sit: Independent;Bed Flat;No Rail  Transfer Type: Squat Pivot  Transfer: Assistance Level: To/From;Wheelchair;Standby Assist  End Of Activity Status: Up in Chair;Nursing Notified;Instructed Patient to Request Assist with Mobility;Instructed Patient to Use Call Light  Comments: Assist required only due to poor brakes of wheelchair, 1 person needed ot stablize chair during transfer, RN made aware of needs for patient to transfer back to bed.     BALANCE:  Balance  Sitting Balance: Dynamic Sitting Balance;2 UE Support;Standby Assist    WHEELCHAIR MOBILITY:  Wheelchair Mobility  Comments: Declines mobility at this time, awaiting breakfast and requests to use urinal.     EDUCATION:  Education Persons Educated: Patient  Patient Barriers To Learning: None Noted  Interventions: Demonstration Provided  Teaching Methods: Verbal Instruction;Demonstration  Patient Response: Verbalized Understanding;Return Demonstration;More Instruction Required  Topics: Plan/Goals of PT Interventions;Use of Assistive Device/Orthosis;Safety Awareness;Up with Assist Only;Equipment Recommendations;Recommend Continued Therapy    ASSESSMENT/PROGRESS:  Assessment/Progress  Impaired Mobility Due To: Pain;Safety Concerns  Assessment/Progress: Should Improve w/ Continued PT  Comments: One more visit, assess wheelchair mobility.   AM-PAC 6 Clicks Basic Mobility Inpatient  Turning from your back to your side while in a flat bed without using bed rails: None  Moving from lying on your back to sitting on the side of a flatbed without using bedrails : None  Moving to and from a bed to a chair (including a wheelchair): None  Standing up from a chair using your arms (e.g. wheelchair, or bedside chair): None  To walk in hospital room: Total  Climbing 3-5 steps with a railing: Total  Raw Score: 18  Standardized (T-scale) Score: 41.05  Basic Mobility CMS 0-100%: 40.47  CMS G Code Modifier for Basic Mobility: CK    GOALS:  Goals  Goal Formulation: With Patient  Time For Goal Achievement: 5 days  Pt Will Transfer Bed/Chair: Independently, Ongoing  Pt Will Transfer Sit to Stand: w/ Stand By Assist, Met, Discontinued, Independently  Pt Will Propel Wheelchair: >150 Feet, Independently, Ongoing    PLAN:  Plan   Treatment Interventions: Mobility Training;Wheelchair Assessment  Plan Frequency: 3-5 Days per Week  PT Plan for Next Visit: Assess sit to/from stand with RW, wheelchair mobility    RECOMMENDATIONS:  PT Discharge Recommendations  PT Discharge Recommendations: Home  Equipment Recommendations: Patient owns necessary equipment    Therapist: Wynona Canes  , PT  Date: 11/06/2017

## 2017-11-06 NOTE — Progress Notes
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 Will have wound care do dressing changes.  DVT Prophylaxis: Mechanical, Chemoprophylaxis: per primary  Will continue to follow    Bryan DaftSpencer Rifky Lapre, MD  [phone]  ----------------------------------------------------------------------------------------------------------    S: No acute events.  Pain well-controlled on current regimen.  Patient tolerating current diet.     O:  Blood pressure 138/80, pulse 82, temperature 36.6 C (97.9 F), height 182.9 cm (72"), weight 90.5 kg (199 lb 9.6 oz), SpO2 100 %.   General: AAOx3, NAD  Cardiac: RRR  Respirations: Unlabored  Abdomen: soft, nt  Extremities: right lower extremity compartments soft, wiggles toes , + ankle pf df, SILT, distal cap refill < 2 sec. TTP around incision  Incisions:  Clean dry and intact. WV in place          No results for input(s): PTT, INR in the last 72 hours.    CBC w/Diff    Lab Results   Component Value Date/Time    WBC 8.0 11/06/2017 04:01 AM    HGB 10.3 (L) 11/06/2017 04:01 AM    HCT 31.4 (L) 11/06/2017 04:01 AM    PLTCT 308 11/06/2017 04:01 AM            Basic Metabolic Profile    Lab Results   Component Value Date/Time    NA 140 11/06/2017 04:01 AM    K 3.8 11/06/2017 04:01 AM    CL 105 11/06/2017 04:01 AM    CO2 27 11/06/2017 04:01 AM    GAP 8 11/06/2017 04:01 AM    Lab Results   Component Value Date/Time    BUN 8 11/06/2017 04:01 AM    CR 0.70 11/06/2017 04:01 AM    GLU 297 (H) 11/06/2017 04:01 AM

## 2017-11-07 LAB — POC GLUCOSE
Lab: 105 mg/dL — ABNORMAL HIGH (ref 70–100)
Lab: 138 mg/dL — ABNORMAL HIGH (ref 70–100)
Lab: 155 mg/dL — ABNORMAL HIGH (ref 70–100)
Lab: 188 mg/dL — ABNORMAL HIGH (ref 70–100)
Lab: 202 mg/dL — ABNORMAL HIGH (ref 70–100)
Lab: 217 mg/dL — ABNORMAL HIGH (ref 70–100)
Lab: 76 mg/dL (ref 70–100)

## 2017-11-07 LAB — COMPREHENSIVE METABOLIC PANEL: Lab: 136 MMOL/L — ABNORMAL LOW (ref >60)

## 2017-11-07 LAB — CBC AND DIFF: Lab: 6.3 K/UL — ABNORMAL HIGH (ref 4.5–11.0)

## 2017-11-07 MED ORDER — INSULIN ASPART 100 UNIT/ML SC FLEXPEN
16 [IU] | Freq: Three times a day (TID) | SUBCUTANEOUS | 0 refills | Status: DC
Start: 2017-11-07 — End: 2017-11-20
  Administered 2017-11-13: 18:00:00 16 [IU] via SUBCUTANEOUS

## 2017-11-07 MED ORDER — LACTATED RINGERS IV SOLP
1000 mL | INTRAVENOUS | 0 refills | Status: DC
Start: 2017-11-07 — End: 2017-11-07

## 2017-11-07 MED ORDER — INSULIN GLARGINE 100 UNIT/ML (3 ML) SC INJ PEN
45 [IU] | Freq: Every evening | SUBCUTANEOUS | 0 refills | Status: DC
Start: 2017-11-07 — End: 2017-11-08

## 2017-11-07 NOTE — Progress Notes
1500- Assumed care of patient at this time.  I will continue to monitor.

## 2017-11-07 NOTE — Progress Notes
Progress Note - Medicine      Today's Date:  11/06/2017  Date of Admission: 11/02/2017  5:59 PM  HD # LOS: 4 days    Name: Bryan Bell                DOB: Jul 02, 1981         Age: 36 y.o.       MRN:  [id number]         Assessment/Plan:     Active Problems:    Wound, open, back    Uncontrolled type 2 diabetes mellitus with hyperglycemia, with long-term current use of insulin (HCC)    Hx of BKA, left (HCC)    Homelessness    Sepsis due to methicillin resistant Staphylococcus aureus (MRSA) (HCC)    Medical non-compliance    Severe malnutrition (HCC)      ====================================================================================================    [name] is a 36 y.o. 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  RLE wound  - ESR = 73  - no ostial destruction on the plain films   - MRI w/ soft tissue ulcer, no evidence of osteomyelitis c/w cellulitis possible myositis   - ortho consulted -status post I&D and wound VAC on 11/17  - ID consulted  - cont Vanc.  Zosyn dc'd  - cont current pain control, but will need to be titrated prior to discharge d/t lack of outside prescriber   - wound GS w/ GPC resembling staph, GPC resembling strep  -Wound culture with MRSA  - 1/4 SIRS w/ tachycardia only  - blood cx NGTD  ???  H/o multiple MRSA wounds  - cont CHG baths, mupirocin to nares  ???  Uncontrolled DM2  - compliance c/b lack of insurance  - cont aspart 15u TID AC  - increase lantus to 40u QHS  - [facility]  - diabetes RN consulted  ???  FEN: no IVF, replace lytes PRN, ADA diet  Ppx: lovenox  Code: Full  ???  Dispo: cont inpt admit, likely discharged home once ready, patient currently homeless       Crista Elliot, MD  Med Private D- [id number]      __________________________________________________________________________________  Subjective: No acute overnight events.  Patient reports continued ongoing extremity pain that is severe requiring IV pain medication.  Denies any fevers or chills.  No chest pain or shortness of breath.  No nausea or vomiting.  Has not had a bowel movement since admission.      Objective:  Medications:  Scheduled Meds:  enoxaparin (LOVENOX) syringe 40 mg 40 mg Subcutaneous QDAY(21)   insulin aspart U-100 (NOVOLOG FLEXPEN) injection PEN 0-7 Units 0-7 Units Subcutaneous ACHS   insulin aspart U-100 (NOVOLOG FLEXPEN) injection PEN 15 Units 15 Units Subcutaneous TID w/ meals   insulin glargine (LANTUS SOLOSTAR, BASAGLAR) injection PEN 40 Units 40 Units Subcutaneous QHS(22)   mupirocin (BACTROBAN) 2 % topical ointment  Topical BID   polyethylene glycol 3350 (MIRALAX) packet 17 g 1 packet Oral BID   senna/docusate (SENOKOT-S) tablet 2 tablet 2 tablet Oral BID   vancomycin (VANCOCIN) 2 g in sodium chloride 0.9% (NS) IVPB 2 g Intravenous Q12H*   Continuous Infusions:  ??? lactated ringers infusion 1,000 mL (11/05/17 1400)     PRN and Respiratory Meds:acetaminophen Q6H PRN, HYDROcodone/acetaminophen Q4H PRN, morphine  injection syringe Q4H PRN, ondansetron (ZOFRAN)  IV Q6H PRN, traZODone QHS PRN, vancomycin   IVPB Q12H* **AND** vancomycin, pharmacy to manage Per Pharmacy, vitamin A & D PRN       Vitals:                Vital Signs:  Last Filed             Vital Signs:  24 Hour Range   BP: 140/75 (11/17 1620)  Temp: 36.4 ???C (97.6 ???F) (11/17 1620)  Pulse: 89 (11/17 1620)  Respirations: 16 PER MINUTE (11/17 1620)  SpO2: 100 % (11/17 1620)  O2 Delivery: None (Room Air) (11/17 1620)  BP: (138-145)/(75-80)   Temp:  [36.3 ???C (97.4 ???F)-36.6 ???C (97.9 ???F)]   Pulse:  [82-89]   Respirations:  [16 PER MINUTE]   SpO2:  [100 %]   O2 Delivery: None (Room Air)   Intensity Pain Scale (Self Report): (not recorded) Vitals:    11/02/17 1743 11/03/17 0000   Weight: 99.8 kg (220 lb) 90.5 kg (199 lb 9.6 oz)       Wt Readings from Last 1 Encounters: 11/03/17 90.5 kg (199 lb 9.6 oz)       PIPP Score      FLACC         Intake/Output Summary :  (Last 24 hours)    Intake/Output Summary (Last 24 hours) at 11/06/17 2053  Last data filed at 11/06/17 1827   Gross per 24 hour   Intake             1200 ml   Output             3175 ml   Net            -1975 ml       Physical Exam:  Body mass index is 27.07 kg/m???.  Constitutional: Alert, no distress  Head: Normocephalic, atraumatic  Eyes: Sclera anicteric  Heart: Regular rate and rhythm, no murmur  Lungs: Clear to auscultation bilaterally  Abdomen: Soft, nontender, normal active bowel sounds  Extremities: Right lower extremity with dressing in place, C/D/I  Neuro: Nonfocal    Lab Review  Recent Labs      11/04/17   0508  11/05/17   0443  11/06/17   0401   NA  135*  138  140   K  3.6  3.9  3.8   CL  105  105  105   CO2  26  26  27    GAP  4  7  8    BUN  7  9  8    CR  0.69  0.63  0.70   GLU  374*  287*  297*   CA  8.3*  8.7  8.6   ALBUMIN  2.7*  2.9*  2.9*       Recent Labs      11/04/17   0508  11/05/17   0443  11/06/17   0401   WBC  6.9  5.6  8.0   HGB  9.0*  10.3*  10.3*   HCT  26.2*  30.7*  31.4*   PLTCT  222  267  308   AST  11  11  19    ALT  9  10  15    ALKPHOS  78  83  89      Estimated Creatinine Clearance: 188.5 mL/min (based on SCr of 0.7 mg/dL).  Vitals:    11/02/17 1743 11/03/17 0000   Weight: 99.8 kg (220  lb) 90.5 kg (199 lb 9.6 oz)    No results for input(s): PHART, PO2ART in the last 72 hours.    Invalid input(s): PC02A      Point of Care Testing  (Last 24 hours)  Glucose: (!) 297 (11/06/17 0401)  POC Glucose (Download): (!) 188 (11/06/17 2028)    Radiology and other Diagnostics Review:    Pertinent radiology reviewed.

## 2017-11-08 LAB — POC GLUCOSE
Lab: 164 mg/dL — ABNORMAL HIGH (ref 70–100)
Lab: 116 mg/dL — ABNORMAL HIGH (ref >60)
Lab: 101 mg/dL — ABNORMAL HIGH (ref 70–100)
Lab: 185 mg/dL — ABNORMAL HIGH (ref 70–100)

## 2017-11-08 LAB — COMPREHENSIVE METABOLIC PANEL
Lab: 0.2 mg/dL — ABNORMAL LOW (ref 0.3–1.2)
Lab: 1.3 mg/dL — ABNORMAL HIGH (ref 0.4–1.24)
Lab: 100 U/L — ABNORMAL LOW (ref 25–110)
Lab: 101 mg/dL — ABNORMAL HIGH (ref 70–100)
Lab: 107 MMOL/L — ABNORMAL LOW (ref 98–110)
Lab: 141 MMOL/L — ABNORMAL LOW (ref 137–147)
Lab: 18 mg/dL (ref 7–25)
Lab: 27 MMOL/L (ref 21–30)
Lab: 29 U/L (ref 7–56)
Lab: 3.2 g/dL — ABNORMAL LOW (ref 3.5–5.0)
Lab: 33 U/L (ref 7–40)
Lab: 4 MMOL/L — ABNORMAL LOW (ref 3.5–5.1)
Lab: 6.6 g/dL (ref 6.0–8.0)
Lab: 60 mL/min — ABNORMAL LOW (ref >60)
Lab: 7 10*3/uL (ref 3–12)
Lab: 8.9 mg/dL (ref 8.5–10.6)
Lab: >60 mL/min (ref >60)

## 2017-11-08 LAB — CBC AND DIFF
Lab: 0 10*3/uL (ref 0–0.20)
Lab: 0.1 10*3/uL (ref 0–0.45)
Lab: 8.3 K/UL (ref 4.5–11.0)

## 2017-11-08 LAB — CULTURE-BLOOD W/SENSITIVITY

## 2017-11-08 MED ORDER — LINEZOLID 600 MG PO TAB
600 mg | Freq: Two times a day (BID) | ORAL | 0 refills | Status: DC
Start: 2017-11-08 — End: 2017-11-14
  Administered 2017-11-08 – 2017-11-14 (×12): 600 mg via ORAL

## 2017-11-08 MED ORDER — INSULIN GLARGINE 100 UNIT/ML (3 ML) SC INJ PEN
40 [IU] | Freq: Every evening | SUBCUTANEOUS | 0 refills | Status: DC
Start: 2017-11-08 — End: 2017-11-20
  Administered 2017-11-12 – 2017-11-19 (×2): 40 [IU] via SUBCUTANEOUS

## 2017-11-08 MED ORDER — DIPHENHYDRAMINE HCL 25 MG PO CAP
25 mg | ORAL | 0 refills | Status: DC | PRN
Start: 2017-11-08 — End: 2017-11-13
  Administered 2017-11-08 – 2017-11-10 (×3): 25 mg via ORAL

## 2017-11-08 MED ORDER — NPH INSULIN HUMAN RECOMB 100 UNIT/ML (3 ML) SC PEN
22 [IU] | Freq: Two times a day (BID) | SUBCUTANEOUS | 0 refills | Status: DC
Start: 2017-11-08 — End: 2017-11-09
  Administered 2017-11-08: 21:00:00 22 [IU] via SUBCUTANEOUS

## 2017-11-08 NOTE — Case Management (ED)
Case Management Progress Note    NAME:Bryan Bell                          MRN: [id number]              DOB:1981-04-30          AGE: 36 y.o.  ADMISSION DATE: 11/02/2017             DAYS ADMITTED: LOS: 6 days      Todays Date: 11/08/2017    Plan  Pt to d/c to homeless shelter when medically stable      Interventions  ? Support      ? Info or Referral      ? Discharge Planning    Pt currently has Wound Vac which will be unable to d/c until removed to wet to dry dressing.  IV abx have been d/ced and now on Zyvox which will need [facility] [place] at d/c and Medication assist program for future fills unless only need 30 days supply.  Pt is uninsured but cardon working on [employer] application.  No therapy recommendations at this time.  No other CM needs identified at this time  NCm will continue to assess and monitor for changes    .       ? Medication Needs   Medication Needs: Other  ? Financial   Financial: Mining engineerCardon Referral/Follow-up, Financial Counseling Referral/Follow-up  ? Legal      ? Other        Disposition  ? Expected Discharge Date    Expected Discharge Date: 11/11/17  ? Transportation   Does the patient need discharge transport arranged?: Yes  Transportation Name, Phone and Availability #1: bus pass  Does the patient use Medicaid Transportation?: No  ? Next Level of Care (Acute Psych discharges only)      ? Discharge Disposition                                          Durable Medical Equipment     No service has been selected for the patient.      [facility]     No service has been selected for the patient.      [facility]     No service has been selected for the patient.      [facility]     No service has been selected for the patient.        Zollie BeckersKurt Omarii Scalzo RN-NCM  Case Management Department  [phone]  [phone]

## 2017-11-08 NOTE — Progress Notes
Progress Note - Medicine      Today's Date:  11/07/2017  Date of Admission: 11/02/2017  5:59 PM  HD # LOS: 5 days    Name: Bryan Bell                DOB: Apr 10, 1981         Age: 36 y.o.       MRN:  [id number]         Assessment/Plan:     Active Problems:    Wound, open, back    Uncontrolled type 2 diabetes mellitus with hyperglycemia, with long-term current use of insulin (HCC)    Hx of BKA, left (HCC)    Homelessness    Sepsis due to methicillin resistant Staphylococcus aureus (MRSA) (HCC)    Medical non-compliance    Severe malnutrition (HCC)      ====================================================================================================    [name] is a 36 y.o. 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  RLE wound  - ESR = 73  - no ostial destruction on the plain films   - MRI w/ soft tissue ulcer, no evidence of osteomyelitis c/w cellulitis possible myositis   - ortho consulted -status post I&D and wound VAC on 11/17  - ID consulted  - cont Vanc.  Zosyn dc'd  - wound culture with MRSA and enterococcus   - blood cx NGTD  PLAN  - follow up sensitivities of enterococcus  - cont current pain control, but will need to be titrated prior to discharge d/t lack of outside prescriber   ???  H/o multiple MRSA wounds  - cont CHG baths, mupirocin to nares  ???  Uncontrolled DM2  - compliance c/b lack of insurance  - increase lantus to 45 units  - increase aspart to 16 units  - [facility]  - diabetes RN consulted    Constipation  - continue bowel regimen  - if no BM by tomorrow will start enemas   ???  FEN: no IVF, replace lytes PRN, ADA diet  Ppx: lovenox  Code: Full  ???  Dispo: discharge to home once off IV antibiotics and wound vac discontinued       Bryan Elliot, MD  Med Private D- [id number]      ___________________________________________________________________________ _______  Subjective:  No acute overnight events. Reports continued pain. Slightly improved from yesterday. No fevers or chills. No nausea or vomiting. Still has not had bowel movement.       Objective:  Medications:  Scheduled Meds:    enoxaparin (LOVENOX) syringe 40 mg 40 mg Subcutaneous QDAY(21)   insulin aspart U-100 (NOVOLOG FLEXPEN) injection PEN 0-7 Units 0-7 Units Subcutaneous ACHS   insulin aspart U-100 (NOVOLOG FLEXPEN) injection PEN 16 Units 16 Units Subcutaneous TID w/ meals   insulin glargine (LANTUS SOLOSTAR, BASAGLAR) injection PEN 45 Units 45 Units Subcutaneous QHS(22)   polyethylene glycol 3350 (MIRALAX) packet 17 g 1 packet Oral BID   senna/docusate (SENOKOT-S) tablet 2 tablet 2 tablet Oral BID   vancomycin (VANCOCIN) 2 g in sodium chloride 0.9% (NS) IVPB 2 g Intravenous Q12H*   Continuous Infusions:    PRN and Respiratory Meds:acetaminophen Q6H PRN, HYDROcodone/acetaminophen Q4H PRN, morphine  injection syringe Q4H PRN, ondansetron (ZOFRAN) IV Q6H PRN, traZODone QHS PRN, vancomycin   IVPB Q12H* **AND** vancomycin, pharmacy to manage Per Pharmacy, vitamin A & D  PRN       Vitals:                Vital Signs:  Last Filed             Vital Signs:  24 Hour Range   BP: 132/79 (11/18 1633)  Temp: 36.4 ???C (97.5 ???F) (11/18 1633)  Pulse: 92 (11/18 1633)  Respirations: 19 PER MINUTE (11/18 1633)  SpO2: 100 % (11/18 1633)  O2 Delivery: None (Room Air) (11/18 1633)  BP: (119-140)/(59-79)   Temp:  [36.4 ???C (97.5 ???F)-36.9 ???C (98.5 ???F)]   Pulse:  [83-92]   Respirations:  [17 PER MINUTE-19 PER MINUTE]   SpO2:  [98 %-100 %]   O2 Delivery: None (Room Air)   Intensity Pain Scale (Self Report): (not recorded) Vitals:    11/02/17 1743 11/03/17 0000   Weight: 99.8 kg (220 lb) 90.5 kg (199 lb 9.6 oz)       Wt Readings from Last 1 Encounters:   11/03/17 90.5 kg (199 lb 9.6 oz)       PIPP Score      FLACC         Intake/Output Summary :  (Last 24 hours)    Intake/Output Summary (Last 24 hours) at 11/07/17 2227 Last data filed at 11/07/17 1940   Gross per 24 hour   Intake              957 ml   Output             2675 ml   Net            -1718 ml       Physical Exam:  Body mass index is 27.07 kg/m???.  Constitutional: Alert, no distress  Head: Normocephalic, atraumatic  Eyes: Sclera anicteric  Heart: Regular rate and rhythm, no murmur  Lungs: Clear to auscultation bilaterally  Abdomen: Soft, nontender, normal active bowel sounds  Extremities: Right lower extremity with dressing in place, C/D/I  Neuro: Nonfocal    Lab Review  Recent Labs      11/05/17   0443  11/06/17   0401  11/07/17   0351   NA  138  140  136*   K  3.9  3.8  3.9   CL  105  105  103   CO2  26  27  27    GAP  7  8  6    BUN  9  8  10    CR  0.63  0.70  0.60   GLU  287*  297*  341*   CA  8.7  8.6  8.7   ALBUMIN  2.9*  2.9*  3.0*       Recent Labs      11/05/17   0443  11/06/17   0401  11/07/17   0351   WBC  5.6  8.0  6.3   HGB  10.3*  10.3*  10.6*   HCT  30.7*  31.4*  31.3*   PLTCT  267  308  330   AST  11  19  21    ALT  10  15  16    ALKPHOS  83  89  97      Estimated Creatinine Clearance: 188.5 mL/min (based on SCr of 0.6 mg/dL).  Vitals:    11/02/17 1743 11/03/17 0000   Weight: 99.8 kg (220 lb) 90.5 kg (199 lb 9.6 oz)    No results for input(s): PHART, PO2ART in  the last 72 hours.    Invalid input(s): PC02A      Point of Care Testing  (Last 24 hours)  Glucose: (!) 341 (11/07/17 0351)  POC Glucose (Download): (!) 164 (11/07/17 2116)    Radiology and other Diagnostics Review:    Pertinent radiology reviewed.

## 2017-11-08 NOTE — Progress Notes
PHYSICAL THERAPY  PROGRESS NOTE       MOBILITY:  Mobility  Progressive Mobility Level: Active transfer to chair  Level of Assistance: Assist X1  Assistive Device: Wheelchair  Time Tolerated: 11-30 minutes  Activity Limited By: Dizziness    SUBJECTIVE:  Subjective  Significant hospital events: 36 y.o. male with past medical history of uncontrolled diabetes, COPD, hyperlipidemia, hypertension, depression, anxiety, multiple abscesses, MRSA, and sleep apnea who presented to the emergency department on 11/02/2017 with right lower extremity pain and wound with hematogenous and purulent drainage. He uses a wheelchair in his day-to-day life due to a history of a left below-knee amputation.    Mental / Cognitive Status: Alert;Oriented;Cooperative  Pain: Patient complains of pain;Patient does not rate pain  Pain Location: Right;Leg  Pain Interventions: Patient agrees to participate in therapy  Comments: LLE BKA, reports prothesis does not fit anymore.    R LE Precautions: RLE WBAT: Weight Bearing as Tolerated  Ambulation Assist: Administrator, sports  Patient Owned Equipment: Media planner  Type of Home: Homeless  Comments: Pt reports he could likely d/c to a friends hotel room. He reports two falls in the last 3 months.        BED MOBILITY/TRANSFERS:  Bed Mobility/Transfers  Bed Mobility: Supine to Sit: Independent  Bed Mobility: Sit to Supine: Independent  Transfer Type: Stand Pivot  Transfer: Assistance Level: Bed;To/From;Wheelchair;Minimal Assist  Transfers: Type Of Assistance: Verbal Cues;For Balance;For Safety Considerations  End Of Activity Status: In Bed;Nursing Notified;Instructed Patient to Request Assist with Mobility;Instructed Patient to Use Call Light       WHEELCHAIR MOBILITY:  Wheelchair Mobility  Wheelchair: Distance: 400 feet  Wheelchair: Assistance Level: Independent  Comments: Patient c/o dizziness with symptoms worsening with increase time up in chair. Patient taken back to room with RN present and BP taken WNL.        EDUCATION:  Education  Persons Educated: Patient  Patient Barriers To Learning: None Noted  Teaching Methods: Verbal Instruction  Patient Response: Verbalized Understanding  Topics: Plan/Goals of PT Interventions;Safety Awareness;Importance of Increasing Activity;Up with Assist Only;Recommend Continued Therapy    ASSESSMENT/PROGRESS:  Assessment/Progress  Impaired Mobility Due To: Pain;Safety Concerns  Assessment/Progress: Should Improve w/ Continued PT  Comments: Patient having dizziness while up in wheelchair; patient became lethargic however oriented during session. Vitals stable at end of session. Safety concern with transfer to personal wheelchair due to brakes not locking to wheels.   AM-PAC 6 Clicks Basic Mobility Inpatient  Turning from your back to your side while in a flat bed without using bed rails: None  Moving from lying on your back to sitting on the side of a flatbed without using bedrails : None  Moving to and from a bed to a chair (including a wheelchair): A Little  Standing up from a chair using your arms (e.g. wheelchair, or bedside chair): A Little  To walk in hospital room: Total  Climbing 3-5 steps with a railing: Total  Raw Score: 16  Standardized (T-scale) Score: 38.32  Basic Mobility CMS 0-100%: 47.12  CMS G Code Modifier for Basic Mobility: CK    GOALS:  Goals  Goal Formulation: With Patient  Time For Goal Achievement: 5 days  Pt Will Transfer Bed/Chair: Independently, Ongoing  Pt Will Transfer Sit to Stand: w/ Stand By Assist, Met, Discontinued, Independently  Pt Will Propel Wheelchair: >150 Feet, Independently, Ongoing    PLAN:  Plan   Treatment Interventions: Mobility Training;Wheelchair Assessment  Plan Frequency: 3-5 Days per  Week  PT Plan for Next Visit: Work on pivot transfer.     RECOMMENDATIONS:  PT Discharge Recommendations  PT Discharge Recommendations: Home Equipment Recommendations: Patient owns necessary equipment    Therapist: Particia Jasper  Date: 11/08/2017

## 2017-11-09 ENCOUNTER — Encounter: Admit: 2017-11-09 | Discharge: 2017-11-09 | Payer: MEDICAID

## 2017-11-09 DIAGNOSIS — I10 Essential (primary) hypertension: ICD-10-CM

## 2017-11-09 DIAGNOSIS — F99 Mental disorder, not otherwise specified: ICD-10-CM

## 2017-11-09 DIAGNOSIS — J449 Chronic obstructive pulmonary disease, unspecified: ICD-10-CM

## 2017-11-09 DIAGNOSIS — E785 Hyperlipidemia, unspecified: ICD-10-CM

## 2017-11-09 DIAGNOSIS — E119 Type 2 diabetes mellitus without complications: Principal | ICD-10-CM

## 2017-11-09 LAB — C DIFFICILE BY PCR: Lab: POSITIVE

## 2017-11-09 LAB — POC GLUCOSE
Lab: 128 mg/dL — ABNORMAL HIGH (ref 70–100)
Lab: 142 mg/dL — ABNORMAL HIGH (ref 70–100)
Lab: 157 mg/dL — ABNORMAL HIGH (ref 70–100)
Lab: 157 mg/dL — ABNORMAL HIGH (ref 70–100)

## 2017-11-09 LAB — CULTURE-WOUND/TISSUE/FLUID(AEROBIC ONLY)W/SENSITIVITY

## 2017-11-09 LAB — CBC AND DIFF: Lab: 9.4 K/UL — ABNORMAL LOW (ref 4.5–11.0)

## 2017-11-09 LAB — COMPREHENSIVE METABOLIC PANEL: Lab: 138 MMOL/L — ABNORMAL LOW (ref 137–147)

## 2017-11-09 MED ORDER — MORPHINE 2 MG/ML IV CRTG
2 mg | INTRAVENOUS | 0 refills | Status: DC | PRN
Start: 2017-11-09 — End: 2017-11-12
  Administered 2017-11-09 – 2017-11-12 (×11): 2 mg via INTRAVENOUS

## 2017-11-09 MED ORDER — LACTATED RINGERS IV SOLP
1000 mL | Freq: Once | INTRAVENOUS | 0 refills | Status: CP
Start: 2017-11-09 — End: ?
  Administered 2017-11-09: 17:00:00 1000 mL via INTRAVENOUS

## 2017-11-09 MED ORDER — HYDROCODONE-ACETAMINOPHEN 5-325 MG PO TAB
1-2 | ORAL | 0 refills | Status: DC | PRN
Start: 2017-11-09 — End: 2017-11-19
  Administered 2017-11-09 – 2017-11-10 (×3): 1 via ORAL
  Administered 2017-11-10: 15:00:00 2 via ORAL
  Administered 2017-11-10 – 2017-11-11 (×2): 1 via ORAL
  Administered 2017-11-11 – 2017-11-16 (×14): 2 via ORAL
  Administered 2017-11-16: 18:00:00 1 via ORAL
  Administered 2017-11-17 – 2017-11-18 (×6): 2 via ORAL

## 2017-11-09 MED ORDER — VANCOMYCIN 25 MG/ML PO SOLR
125 mg | Freq: Four times a day (QID) | ORAL | 0 refills | Status: DC
Start: 2017-11-09 — End: 2017-11-20
  Administered 2017-11-09 – 2017-11-19 (×34): 125 mg via ORAL

## 2017-11-09 NOTE — Progress Notes
Progress Note - Medicine      Today's Date:  11/08/2017  Date of Admission: 11/02/2017  5:59 PM  HD # LOS: 6 days    Name: Bryan Bell                DOB: 1981/06/09         Age: 36 y.o.       MRN:  [id number]         Assessment/Plan:     Active Problems:    Wound, open, back    Uncontrolled type 2 diabetes mellitus with hyperglycemia, with long-term current use of insulin (HCC)    Hx of BKA, left (HCC)    Homelessness    Sepsis due to methicillin resistant Staphylococcus aureus (MRSA) (HCC)    Medical non-compliance    Severe malnutrition (HCC)      ====================================================================================================    [name] is a 36 y.o. 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  RLE wound  - ESR = 73  - no ostial destruction on the plain films   - MRI w/ soft tissue ulcer, no evidence of osteomyelitis c/w cellulitis possible myositis   - ortho consulted -status post I&D and wound VAC on 11/17  - ID consulted  - wound culture with MRSA and enterococcus   - blood cx NGTD  PLAN  - change to linezolid due to AKI per ID   - cont current pain control, but will need to be titrated prior to discharge d/t lack of outside prescriber   ???  H/o multiple MRSA wounds  - cont CHG baths, mupirocin to nares  ???  Uncontrolled DM2  - compliance c/b lack of insurance  - lantus 40, aspart 16  - [facility]  - diabetes RN consulted    Constipation  - BM today  - continue bowel regimen  ???  FEN: no IVF, replace lytes PRN, ADA diet  Ppx: lovenox  Code: Full  ???  Dispo: discharge to home once off IV antibiotics and wound vac discontinued       Crista Elliot, MD  Med Private D- [id number]      __________________________________________________________________________________  Subjective:  No acute overnight events. Reports continued pain, slightly improved from yesterday. Had a bowel movement. Good appetite. No fevers or chills.       Objective:  Medications:  Scheduled Meds:    enoxaparin (LOVENOX) syringe 40 mg 40 mg Subcutaneous QDAY(21)   insulin aspart U-100 (NOVOLOG FLEXPEN) injection PEN 0-7 Units 0-7 Units Subcutaneous ACHS   insulin aspart U-100 (NOVOLOG FLEXPEN) injection PEN 16 Units 16 Units Subcutaneous TID w/ meals   insulin NPH (HUMULIN N KwikPen) injection PEN 22 Units 22 Units Subcutaneous BID(8-21)   linezolid (ZYVOX) tablet 600 mg 600 mg Oral BID   polyethylene glycol 3350 (MIRALAX) packet 17 g 1 packet Oral BID   senna/docusate (SENOKOT-S) tablet 2 tablet 2 tablet Oral BID   Continuous Infusions:    PRN and Respiratory Meds:acetaminophen Q6H PRN, diphenhydrAMINE Q6H PRN, HYDROcodone/acetaminophen Q4H PRN, morphine  injection syringe Q4H PRN, ondansetron (ZOFRAN) IV Q6H PRN, traZODone QHS PRN, vitamin A & D PRN       Vitals:                Vital Signs:  Last Filed  Vital Signs:  24 Hour Range   BP: 145/83 (11/19 1535)  Temp: 36.5 ???C (97.7 ???F) (11/19 1535)  Pulse: 86 (11/19 1535)  Respirations: 16 PER MINUTE (11/19 1535)  SpO2: 100 % (11/19 1535)  O2 Delivery: None (Room Air) (11/19 1535)  BP: (131-151)/(75-84)   Temp:  [36.3 ???C (97.3 ???F)-36.8 ???C (98.2 ???F)]   Pulse:  [86-92]   Respirations:  [16 PER MINUTE-18 PER MINUTE]   SpO2:  [98 %-100 %]   O2 Delivery: None (Room Air)   Intensity Pain Scale (Self Report): 9 (11/08/17 2010) Vitals:    11/02/17 1743 11/03/17 0000   Weight: 99.8 kg (220 lb) 90.5 kg (199 lb 9.6 oz)       Wt Readings from Last 1 Encounters:   11/03/17 90.5 kg (199 lb 9.6 oz)       PIPP Score      FLACC         Intake/Output Summary :  (Last 24 hours)    Intake/Output Summary (Last 24 hours) at 11/08/17 2137  Last data filed at 11/08/17 2010   Gross per 24 hour   Intake             2393 ml   Output             3150 ml   Net             -757 ml       Physical Exam:  Body mass index is 27.07 kg/m???. Constitutional: Alert, no distress  Head: Normocephalic, atraumatic  Eyes: Sclera anicteric  Heart: Regular rate and rhythm, no murmur  Lungs: Clear to auscultation bilaterally  Abdomen: Soft, nontender, normal active bowel sounds  Extremities: Right lower extremity with dressing in place, C/D/I  Neuro: Nonfocal    Lab Review  Recent Labs      11/06/17   0401  11/07/17   0351  11/08/17   1152   NA  140  136*  141   K  3.8  3.9  4.0   CL  105  103  107   CO2  27  27  27    GAP  8  6  7    BUN  8  10  18    CR  0.70  0.60  1.36*   GLU  297*  341*  101*   CA  8.6  8.7  8.9   ALBUMIN  2.9*  3.0*  3.2*       Recent Labs      11/06/17   0401  11/07/17   0351  11/08/17   1152   WBC  8.0  6.3  8.3   HGB  10.3*  10.6*  10.8*   HCT  31.4*  31.3*  32.3*   PLTCT  308  330  380   AST  19  21  33   ALT  15  16  29    ALKPHOS  89  97  100      Estimated Creatinine Clearance: 97 mL/min (A) (based on SCr of 1.36 mg/dL (H)).  Vitals:    11/02/17 1743 11/03/17 0000   Weight: 99.8 kg (220 lb) 90.5 kg (199 lb 9.6 oz)    No results for input(s): PHART, PO2ART in the last 72 hours.    Invalid input(s): PC02A      Point of Care Testing  (Last 24 hours)  FSBS (Manual): (!) 185 (11/08/17 1723)  Glucose: (!) 101 (11/08/17 1152)  POC Glucose (Download): (!) 142 (11/08/17 2015)    Radiology and other Diagnostics Review:    Pertinent radiology reviewed.

## 2017-11-09 NOTE — Case Management (ED)
Case Management Progress Note    NAME:Bryan Bell                          MRN: [id number]              DOB:Dec 06, 1981          AGE: 36 y.o.  ADMISSION DATE: 11/02/2017             DAYS ADMITTED: LOS: 7 days      Todays Date: 11/09/2017    Plan  Pt to d/c to homeless shelter once Wound vac is d/ced      Interventions  ? Support      ? Info or Referral      ? Discharge Planning    Pt will need Wound Vac for at least one week post placement which will be 11-24.  Pt will need Zyvox through 10-26 and PO Vanco through 12-3.  Pt likely to d/c on 11-26 to homeless shelter and only need one week of PO Vanco.  Wound vac will be transitions to wet to dry dressings.  Team trying to wean pt off IV pain meds for d/c.  Pt tested CDiff positive today off first sample  Pt follow sup at [facility] for pharmacy and medical needs  No therapy recommendations at this time.  No other CM needs identified at this time  NCm will continue to assess and monitor for changes         ? Medication Needs   Medication Needs: Other   Needing copay check for Zyvox and Po Vanco    ? Financial   Financial: [facility] Referral/Follow-up, Financial Counseling Referral/Follow-up  ? Legal      ? Other        Disposition  ? Expected Discharge Date    Expected Discharge Date: 11/11/17  ? Transportation   Does the patient need discharge transport arranged?: Yes  Transportation Name, Phone and Availability #1: bus pass  Does the patient use Medicaid Transportation?: No  ? Next Level of Care (Acute Psych discharges only)      ? Discharge Disposition                                          Durable Medical Equipment     No service has been selected for the patient.      [place] Dest[facility]     No service has been selected for the patient.      [facility]     No service has been selected for the patient.      [facility]     No service has been selected for the patient.        Zollie BeckersKurt Darren Nodal RN-NCM  Case Management Department  [phone]  [phone]

## 2017-11-09 NOTE — Progress Notes
OCCUPATIONAL THERAPY  PROGRESS NOTE    Patient Name: Bryan Bell                   Room/Bed: AV409/81  Admitting Diagnosis:     Mobility  Progressive Mobility Level: Active transfer to chair  Level of Assistance: Stand by assistance  Assistive Device: Wheelchair  Time Tolerated: 11-30 minutes  Activity Limited By: Weakness    Subjective  Pertinent Dx per Physician:  36 y.o. male with past medical history of uncontrolled diabetes, COPD, hyperlipidemia, hypertension, depression, anxiety, multiple abscesses, MRSA, and sleep apnea who presented to the emergency department on 11/02/2017 with right lower extremity pain and wound with hematogenous and purulent drainage. He uses a wheelchair in his day-to-day life due to a history of a left below-knee amputation.  Precautions: Falls  R LE Precautions: RLE WBAT: Weight Bearing as Tolerated  Pain / Complaints: Patient agrees to participate in therapy  Pain Location: Right;Leg  Comments: Pt supine in bed at OT arrival and agreeable to participate in therapy. Left pt supine in bed with bed alarm activated and call light within reach    Objective  Psychosocial Status: Willing and Cooperative to Participate    Home Living  Type of Home: Homeless  Home Equipment: Wheelchair-manual  Comment: Pt reports previous rehab stay after BKA and has been ambulating with walker with prosthetic, however prosthetic no longer fits    Prior Function  Level Of Independence: Independent with ADLs and functional transfers    ADL's  Where Assessed: Chair  LE Dressing Assist: Stand By Assist  LE Dressing Deficits: Don/Doff L Sock;Don/Doff L Shoe  Functional Transfer Assist: Stand By Assist  Functional Transfer Deficits: Supervision/Safety  Comment: Pt completes transfers with standby assist and assist for steadying wheelchair. Pt gathered and donned R sock and shoe with standby assist. Pt propelled self in wheelchair 1 lap around unit, reports fatigue after activity.    Activity Tolerance Endurance: 3/5 Tolerates 25-30 Minutes Exercise w/Multiple Rests  Sitting Balance: 4/5 Moves/Returns Trunkal Midpoint 1-2 Inches in Multiple Planes    Cognition  Overall Cognitive Status: WFL to Adequately Complete Self Care Tasks Safely    Assessment  Assessment: Decreased ADL Status;Decreased Endurance;Decreased Sensation;Decreased High-Level ADLs  Prognosis: Fair  Goal Formulation: Patient   COMMENT: Pt is increasing independence with ADLs and functional transfers, performing with standby assist this date. However, pt still fatigues quickly with activity. Anticipate pt will continue to progress with occupational therapy in the acute setting and no post acute OT needs anticipated at this time.    AM-PAC 6 Clicks Daily Activity Inpatient  Putting on and taking off regular lower body clothes?: A Little  Bathing (Including washing, rinsing, drying): A Little  Toileting, which includes using toilet, bedpan, or urinal: A Little  Putting on and taking off regular upper body clothing: None  Taking care of personal grooming such as brushing teeth: None  Eating meals?: None  Daily Activity Raw Score: 21  Standardized (t-scale) score: 44.27  CMS 0-100% Score: 32.79  CMS G Code Modifier: CJ    Plan  OT Frequency: 3-5x/week  OT Plan for Next Visit: toileting and toilet transfer, don pants    ADL Goals  Patient Will Perform LE Dressing: w/ Stand By Assist  Patient Will Perform Toileting: w/ Stand By Assist    Functional Transfer Goals  Pt Will Perform All Functional Transfers: w/ Stand By Assist    OT Discharge Recommendations  Discharge Recommendations: Home  Additional  Information: Patient would benefit from information on donated or reduced [facility] DME due to poor condition of current wheelchair.      Therapist: Lorella Nimrod, OTR/L [phone]  Date: 11/09/2017

## 2017-11-10 DIAGNOSIS — A4102 Sepsis due to Methicillin resistant Staphylococcus aureus: Principal | ICD-10-CM

## 2017-11-10 LAB — CBC AND DIFF: Lab: 8 K/UL — ABNORMAL LOW (ref 4.5–11.0)

## 2017-11-10 LAB — COMPREHENSIVE METABOLIC PANEL
Lab: 106 MMOL/L — ABNORMAL HIGH (ref >60)
Lab: 139 MMOL/L — ABNORMAL HIGH (ref 137–147)
Lab: 19 mg/dL — ABNORMAL HIGH (ref 7–25)

## 2017-11-10 LAB — POC GLUCOSE
Lab: 148 mg/dL — ABNORMAL HIGH (ref 70–100)
Lab: 170 mg/dL — ABNORMAL HIGH (ref 70–100)
Lab: 142 mg/dL — ABNORMAL HIGH (ref 70–100)
Lab: 71 mg/dL (ref 70–100)

## 2017-11-10 MED ORDER — ASCORBIC ACID (VITAMIN C) 500 MG PO TAB
500 mg | Freq: Every day | ORAL | 0 refills | Status: DC
Start: 2017-11-10 — End: 2017-11-20
  Administered 2017-11-10 – 2017-11-19 (×7): 500 mg via ORAL

## 2017-11-10 MED ORDER — MULTIVIT-IRON-FA-CALCIUM-MINS 9 MG IRON-400 MCG PO TAB
1 | Freq: Every day | ORAL | 0 refills | Status: DC
Start: 2017-11-10 — End: 2017-11-20
  Administered 2017-11-10 – 2017-11-19 (×7): 1 via ORAL

## 2017-11-10 MED ORDER — ZINC SULFATE 220 (50) MG PO CAP
220 mg | Freq: Every day | ORAL | 0 refills | Status: DC
Start: 2017-11-10 — End: 2017-11-20
  Administered 2017-11-10 – 2017-11-19 (×7): 220 mg via ORAL

## 2017-11-10 MED ORDER — B COMP NO3-FOLIC-C-BIOTIN-ZINC 1-60-300-12.5 MG-MG-MCG-MG PO TAB
1 | Freq: Every day | ORAL | 0 refills | Status: DC
Start: 2017-11-10 — End: 2017-11-10

## 2017-11-10 MED ORDER — OXYCODONE 5 MG PO TAB
10 mg | Freq: Once | ORAL | 0 refills | Status: CP
Start: 2017-11-10 — End: ?
  Administered 2017-11-10: 21:00:00 10 mg via ORAL

## 2017-11-10 MED ORDER — ZINC SULFATE(#) 44MG/ML PO SOLN
220 mg | Freq: Every day | ORAL | 0 refills | Status: DC
Start: 2017-11-10 — End: 2017-11-10

## 2017-11-10 NOTE — Progress Notes
Infectious Diseases Progress Note    11/10/2017    Admission Date: 11/02/2017  LOS: 8 days                     IMPRESSION:  1. Recurrent MRSA abscess. 11/02/17 with lateral right lower leg abscess.   2. psychosocioeconomic circumstance  3. Diabetes mellitus type 1  4. H/o left BKA  5. Pruritis and body scratching  6. Anemia  7. Acute kidney injury  8. C. Diff +PCR 11/08/17  ???  ???  ???  ???  PLAN:  1. Cont mupirocin to nares, Use CHG bathing  2. Due to AKI will change abx to LInezolid PO 600 mg BID. Plan to cont therapy for 7d post-op, only soft tissue disease appreciated.   3. Monitor for drug adverse reaction/toxicity  4. Cont PO vanc for 10d total.   5. Refuses influenza vaccine.   6. Will follow      Tobi Bastos, MD  Infectious Diseases Faculty  Pager: [phone]  ________________________________________________________________________    Interval history:  Bryan Bell is a 36 y.o. male  Pt tolerating linezolid well. Cr increased. In bed. C/o abd discomfort today. Stool is improved. In bed. Denies other new problems.     ROS:   No fevers, no chills  No n/v  No CP/SOB  No new rash               ABX:  Vanc, zosyn --> linezolid 11/08/17    Medications  Scheduled Meds:    enoxaparin (LOVENOX) syringe 40 mg 40 mg Subcutaneous QDAY(21)   insulin aspart U-100 (NOVOLOG FLEXPEN) injection PEN 0-7 Units 0-7 Units Subcutaneous ACHS   insulin aspart U-100 (NOVOLOG FLEXPEN) injection PEN 16 Units 16 Units Subcutaneous TID w/ meals   insulin glargine (LANTUS SOLOSTAR, BASAGLAR) injection PEN 40 Units 40 Units Subcutaneous QHS(22)   linezolid (ZYVOX) tablet 600 mg 600 mg Oral BID   polyethylene glycol 3350 (MIRALAX) packet 17 g 1 packet Oral BID   senna/docusate (SENOKOT-S) tablet 2 tablet 2 tablet Oral BID   vancomycin [facility]) oral solution 125 mg 125 mg Oral QID   Continuous Infusions:    PRN and Respiratory Meds:acetaminophen Q6H PRN, diphenhydrAMINE Q6H PRN, HYDROcodone/acetaminophen Q6H PRN, morphine  injection syringe Q6H PRN, ondansetron (ZOFRAN) IV Q6H PRN, traZODone QHS PRN, vitamin A & D PRN      Objective                       Vital Signs: Last Filed                 Vital Signs: 24 Hour Range   BP: 138/80 (11/21 0801)  Temp: 36.9 ???C (98.4 ???F) (11/21 0801)  Pulse: 80 (11/21 1055)  Respirations: 16 PER MINUTE (11/21 1055)  SpO2: 100 % (11/21 1055)  O2 Delivery: None (Room Air) (11/21 0801) BP: (138-158)/(68-81)   Temp:  [36.7 ???C (98 ???F)-36.9 ???C (98.4 ???F)]   Pulse:  [80-86]   Respirations:  [16 PER MINUTE-18 PER MINUTE]   SpO2:  [98 %-100 %]   O2 Delivery: None (Room Air)   Intensity Pain Scale (Self Report): 9 (11/10/17 1022) Vitals:    11/02/17 1743 11/03/17 0000   Weight: 99.8 kg (220 lb) 90.5 kg (199 lb 9.6 oz)           Physical Exam  Gen: resting, arousable, no acute distress  HEENT:  EOMI, no subconjunctival hemorrhages  CV: RRR  Resp: Clear bilat, air exchange good   Abd, soft, non tender, +BS  Extremity: L BKA, RLE dressed  Derm: no rash/lesions appreciated          Lab Review  Full labs reviewed.     Recent Labs      11/10/17   0448   WBC  8.0           Radiology and other Diagnostics Review: Radiology viewed and reports reviewed    11/04/17 MRI:  IMPRESSION  1. ???Soft tissue ulceration overlying the lateral head of the fibula   without evidence of osteomyelitis.    2. ???Skin thickening, edema and enhancement of the subcutaneous soft   tissues of the leg, nonspecific, but can be seen in setting of cellulitis.   No drainable soft tissue fluid collection is identified.    3. ???Mild edema and enhancement within the multiple proximal leg muscles,   suggestive of myositis, as described above. No drainable intramuscular   fluid collection.        Tobi Bastos, MD

## 2017-11-10 NOTE — Progress Notes
PHYSICAL THERAPY  DISCHARGE SERVICES NOTE        PT met with pt at bedside, Pt reporting he did not sleep well the night prior and wished to rest, pt reports he will transfer later this date to go to procedure, Pt endorses he is at baseline with his squat pivot transfer to wheelchair.  Pt concerned about his wheelchair breaks/locks, pt reports he plans to take his wheelchair to have it fixed, PT passed along information to NCM.  PT will sign off at this time, please re-consult if there are any changes in mobility.    Therapist: Shari Prows, PT DPT [phone]  Date: 11/10/2017

## 2017-11-10 NOTE — Case Management (ED)
_____________________________________________________________________________        Weekend Needs of SW    Instructions for [facility] W/E Staff:  Please f/u with Med D team on if pt is stable for d/c on Saturday. Pt will require medication voucher - see note below for details and SW prices. Bus pass placed in pt's [place]. Pt planning to d/c to hotel with friend vs return to living on the street (pt declined community shelter).     Weekend Contact at agency/facility:  N/A  Weekend Fax:  N/A    Civil Service fast streamer completed (TPOPP & PCS form included as appropriate):  N/A    Additional family to be contacted:  N/A    Medication Voucher needed:  Yes - see note below for details    Additional Resources needed (clothing, meal passes, cab voucher, [facility] Referral, etc):  Bus pass is in pt's [facility]      Case Management Progress Note    NAME:Bryan Bell                          MRN: [id number]              DOB:[birth year]          AGE: 36 y.o.  ADMISSION DATE: 11/02/2017             DAYS ADMITTED: LOS: 8 days      Today???s Date: 11/10/2017    Plan  Pt anticipated to d/c on Saturday and will require a medication voucher     Interventions  ? Support      ? Info or Referral      ? Discharge Planning    Pt discussed in Med D huddle - pt anticipated to be stable for d/c on Saturday 11/24 pending removal of wound vac (to be determined by Ortho). Pt will require 2 days of PO zyvox and 9 days of PO vanco at d/c. Pt will require medication voucher:  ??? zyvox 600mg  #4 $17.07 (SW [place])  ??? vanco 125mg  #36 $40.46 (SW [place])        SW met with pt to discuss d/c plans, pt states he needs to call his friend to determine if he can stay with him at hotel at d/c or not, SW discussed community shelter as alternative, pt declined and states he will not d/c to a community shelter and will plan to stay on the streets if unable to d/c to hotel with friend.     SW placed bus pass on pt's chart in [place]       ? Medication Needs Medication Needs: Other  ? Financial   Financial: Mining engineer, Financial Counseling Referral/Follow-up  ? Legal      ? Other        Disposition  ? Expected Discharge Date    Expected Discharge Date: 11/13/17  ? Transportation   Does the patient need discharge transport arranged?: Yes  Transportation Name, Phone and Availability #1: bus pass  Does the patient use Medicaid Transportation?: No  ? Next Level of Care (Acute Psych discharges only)      ? Discharge Disposition                                          Durable Medical Equipment     No service has been selected for the patient.  [place] [facility]     No service has been selected for the patient.      [facility]     No service has been selected for the patient.      [facility]     No service has been selected for the patient.          Redgie Grayer, LMSW  p. [phone]

## 2017-11-10 NOTE — Progress Notes
OCCUPATIONAL THERAPY  DISCHARGE NOTE    Pt is currently performing ADLs and functional transfers with standby assistance. Per discussion with pt, he has no concerns with completing ADLs after discharge. Safety concerns due to poor wheelchair brakes, however this is a known issue and pt reports he has strategies to improve safety with transfers. Pt declines further activity this am, requesting to sleep. Encouraged continued OOB activity and participation in ADLs. Occupational therapy will sign off at this time, please reconsult if patient experiences a decline in functional status.    Lorella NimrodMegan Prarthana Parlin, OTR/L [phone]

## 2017-11-10 NOTE — Progress Notes
Progress Note - Medicine      Today's Date:  11/09/2017  Date of Admission: 11/02/2017  5:59 PM  HD # LOS: 7 days    Name: Bryan Bell                DOB: 09-13-1981         Age: 36 y.o.       MRN:  [id number]         Assessment/Plan:     Active Problems:    Wound, open, back    Uncontrolled type 2 diabetes mellitus with hyperglycemia, with long-term current use of insulin (HCC)    Hx of BKA, left (HCC)    Homelessness    Sepsis due to methicillin resistant Staphylococcus aureus (MRSA) (HCC)    Medical non-compliance    Severe malnutrition (HCC)      ====================================================================================================    [name] is a 36 y.o. 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  RLE wound  - ESR = 73  - no ostial destruction on the plain films   - MRI w/ soft tissue ulcer, no evidence of osteomyelitis c/w cellulitis possible myositis   - ortho consulted -status post I&D and wound VAC on 11/17  - ID consulted  - wound culture with MRSA and enterococcus   - blood cx NGTD  PLAN  - change to linezolid due to AKI per ID   - cont current pain control, but will need to be titrated prior to discharge d/t lack of outside prescriber   - will need wound vac d/c prior to discharge as no insurance. Discussed with wound nurse, would like to have wound vac on for at least a week, two weeks preferable     Acute kidney injury   - secondary to vanc  - d/c vanc  - 1L LR  - d/c vanc     Cdiff +  - agree with ID, not sure if this represents true infection as he was previously constipated and was receiving lots of bowel medication which was likely source of his diarrhea.   - will go ahead and treat   ???  H/o multiple MRSA wounds  - cont CHG baths, mupirocin to nares  ???  Uncontrolled DM2  - lantus 40, aspart 16  - [facility]  - diabetes RN consulted - patient receives insulin through [facility] and has medications covered     Constipation - resolved  - continue bowel regimen  ???  FEN: no IVF, replace lytes PRN, ADA diet  Ppx: lovenox  Code: Full  ???  Dispo: discharge to home once off IV antibiotics and wound vac discontinued       Crista Elliot, MD  [facility] D- [phone]      __________________________________________________________________________________  Subjective:  No acute overnight events. Ongoing pain. Pain medication brings him down to 5-6. Discussed that this is probably a reasonable goal and that pain will not be zero. Denies fevers or chills. No nausea or vomiting.       Objective:  Medications:  Scheduled Meds:    enoxaparin (LOVENOX) syringe 40 mg 40 mg Subcutaneous QDAY(21)   insulin aspart U-100 (NOVOLOG FLEXPEN) injection PEN 0-7 Units 0-7 Units Subcutaneous ACHS   insulin aspart U-100 (NOVOLOG FLEXPEN) injection PEN 16 Units 16 Units Subcutaneous TID w/ meals   insulin  glargine (LANTUS SOLOSTAR, BASAGLAR) injection PEN 40 Units 40 Units Subcutaneous QHS(22)   linezolid (ZYVOX) tablet 600 mg 600 mg Oral BID   polyethylene glycol 3350 (MIRALAX) packet 17 g 1 packet Oral BID   senna/docusate (SENOKOT-S) tablet 2 tablet 2 tablet Oral BID   vancomycin [facility]) oral solution 125 mg 125 mg Oral QID   Continuous Infusions:    PRN and Respiratory Meds:acetaminophen Q6H PRN, diphenhydrAMINE Q6H PRN, HYDROcodone/acetaminophen Q6H PRN, morphine  injection syringe Q6H PRN, ondansetron (ZOFRAN) IV Q6H PRN, traZODone QHS PRN, vitamin A & D PRN       Vitals:                Vital Signs:  Last Filed             Vital Signs:  24 Hour Range   BP: 158/81 (11/20 1525)  Temp: 36.7 ???C (98.1 ???F) (11/20 1525)  Pulse: 86 (11/20 1525)  Respirations: 16 PER MINUTE (11/20 1525)  SpO2: 100 % (11/20 1525)  O2 Delivery: None (Room Air) (11/20 1525)  BP: (117-158)/(58-81)   Temp:  [36.3 ???C (97.4 ???F)-36.7 ???C (98.1 ???F)]   Pulse:  [85-93]   Respirations:  [16 PER MINUTE] SpO2:  [97 %-100 %]   O2 Delivery: None (Room Air)   Intensity Pain Scale (Self Report): 9 (11/09/17 2020) Vitals:    11/02/17 1743 11/03/17 0000   Weight: 99.8 kg (220 lb) 90.5 kg (199 lb 9.6 oz)       Wt Readings from Last 1 Encounters:   11/03/17 90.5 kg (199 lb 9.6 oz)       PIPP Score      FLACC         Intake/Output Summary :  (Last 24 hours)    Intake/Output Summary (Last 24 hours) at 11/09/17 2209  Last data filed at 11/09/17 2020   Gross per 24 hour   Intake           1877.1 ml   Output             3700 ml   Net          -1822.9 ml       Physical Exam:  Body mass index is 27.07 kg/m???.  Constitutional: Alert, no distress  Head: Normocephalic, atraumatic  Eyes: Sclera anicteric  Heart: Regular rate and rhythm, no murmur  Lungs: Clear to auscultation bilaterally  Abdomen: Soft, nontender, normal active bowel sounds  Extremities: Right lower extremity with dressing in place, C/D/I  Neuro: Nonfocal    Lab Review  Recent Labs      11/07/17   0351  11/08/17   1152  11/09/17   0522   NA  136*  141  138   K  3.9  4.0  4.1   CL  103  107  105   CO2  27  27  27    GAP  6  7  6    BUN  10  18  18    CR  0.60  1.36*  1.60*   GLU  341*  101*  125*   CA  8.7  8.9  8.8   ALBUMIN  3.0*  3.2*  3.1*       Recent Labs      11/07/17   0351  11/08/17   1152  11/09/17   0522   WBC  6.3  8.3  9.4   HGB  10.6*  10.8*  9.9*   HCT  31.3*  32.3*  29.3*   PLTCT  330  380  358   AST  21  33  21   ALT  16  29  21    ALKPHOS  97  100  91      Estimated Creatinine Clearance: 82.5 mL/min (A) (based on SCr of 1.6 mg/dL (H)).  Vitals:    11/02/17 1743 11/03/17 0000   Weight: 99.8 kg (220 lb) 90.5 kg (199 lb 9.6 oz)    No results for input(s): PHART, PO2ART in the last 72 hours.    Invalid input(s): PC02A      Point of Care Testing  (Last 24 hours)  FSBS (Manual): (!) 128 (11/09/17 0753)  Glucose: (!) 125 (11/09/17 0522)  POC Glucose (Download): (!) 148 (11/09/17 2031)    Radiology and other Diagnostics Review:    Pertinent radiology reviewed.

## 2017-11-10 NOTE — Progress Notes
Wound Ostomy Note    NAME:Bryan Bell                                                                   MRN: [id number]                 DOB:07/05/1981          AGE: 36 y.o.  ADMISSION DATE: 11/02/2017             DAYS ADMITTED: LOS: 8 days      Reason for Visit: wound VAC    Assessment/Plan:    Active Problems:    Wound, open, back    Uncontrolled type 2 diabetes mellitus with hyperglycemia, with long-term current use of insulin (HCC)    Hx of BKA, left (HCC)    Homelessness    Sepsis due to methicillin resistant Staphylococcus aureus (MRSA) (HCC)    Medical non-compliance    Severe malnutrition (HCC)    s/p right lower extremity I&D with wound VAC placement on 11/16 by Ortho service    Wounds (NOT for Pressure Injuries) 11/05/17 1544 Leg Surgical Incision (Active)   11/05/17 1544 Leg   Wound Orientation:    Wound Type: Surgical Incision   Wound Type::    Wound Description (Comments):  WOUND VAC, SOFT ROLL, ACE, HYPAFIX   Wound Image    11/10/2017 10:15 AM   Wound Base Assessment Moist;Red;Pink 11/10/2017 10:15 AM   Surrounding Skin Assessment Intact;Macerated 11/10/2017 10:15 AM   Wound Site Closure Wound Adhesive Bandage 11/10/2017 10:15 AM   Wound Drainage Amount Scant 11/10/2017 10:15 AM   Wound Drainage Description Serosanguineous 11/10/2017 10:15 AM   Wound Dressing Status Changed 11/10/2017 10:15 AM   Wound Dressing and / or Treatment VAC sponge - black;VAC tx: Continuous;VAC @ -125 mm/Hg 11/10/2017 10:15 AM     Procedure: Negative Wound Therapy Dressing Change    Anesthesia Type: Not Applicable or None    Provider(s) and Role: RN    Negative Wound Pressure Device Type: KCI VAC    Contact layer: None    Number of sponges in the wound prior to dressing change: 1    Number of sponges removed from the wound: 1    Type of sponge: Black foam    Number of sponges placed in the wound: 1    Marcelino Duster, RN     Wound appears to be responding well to NPWT as wound is filled with granulation tissue and wound margins are flattening. Depth today measured 0.5cm as compared to 1.0cm on last dressing change. Wound cleaned with saline and gauze then dressed with black VAC foam. Dressing connected to wound VAC with settings of continuous - .     Next dressing change on Fri 11/23 will be performed by Ortho Surgery service - this was confirmed by K. Gordy Councilman, PA-C with Ortho service.      Will continue to follow.    Marcelino Duster, RN, BSN, [employer] [facility]  Office: [phone]  Pager: [phone]  [facility] Team Pager (After Hours/Weekends): [phone]

## 2017-11-11 LAB — SODIUM-URINE RANDOM: Lab: 56 MMOL/L

## 2017-11-11 LAB — POC GLUCOSE
Lab: 125 mg/dL — ABNORMAL HIGH (ref 70–100)
Lab: 189 mg/dL — ABNORMAL HIGH (ref 70–100)
Lab: 242 mg/dL — ABNORMAL HIGH (ref 70–100)
Lab: 188 mg/dL — ABNORMAL HIGH (ref 70–100)
Lab: 136 mg/dL — ABNORMAL HIGH (ref 70–100)

## 2017-11-11 LAB — COMPREHENSIVE METABOLIC PANEL: Lab: 139 MMOL/L — ABNORMAL LOW (ref 137–147)

## 2017-11-11 LAB — CREATININE-URINE RANDOM: Lab: 28 mg/dL

## 2017-11-11 LAB — CULTURE-ANAEROBIC

## 2017-11-11 LAB — CBC AND DIFF: Lab: 9.9 10*3/uL — ABNORMAL LOW (ref 4.5–11.0)

## 2017-11-11 MED ORDER — OXYCODONE 5 MG PO TAB
10 mg | Freq: Once | ORAL | 0 refills | Status: CP
Start: 2017-11-11 — End: ?
  Administered 2017-11-11: 10:00:00 10 mg via ORAL

## 2017-11-11 NOTE — Progress Notes
Infectious Diseases Progress Note    11/11/2017    Admission Date: 11/02/2017  LOS: 9 days                     IMPRESSION:  1. Recurrent MRSA abscess. 11/02/17 with lateral right lower leg abscess.   2. psychosocioeconomic circumstance  3. Diabetes mellitus type 1  4. H/o left BKA  5. Pruritis and body scratching  6. Anemia  7. Acute kidney injury  8. C. Diff +PCR 11/08/17  ???  ???  ???  ???  PLAN:  1. Cont mupirocin to nares, Use CHG bathing  2. Due to AKI, changed abx to LInezolid PO 600 mg BID. Plan to cont therapy for 7d post-op, only soft tissue disease appreciated.   3. Monitor for drug adverse reaction/toxicity  4. Cont PO vanc for 10d total.   5. Refuses influenza vaccine.   6. Will follow      Tobi Bastos, MD  Infectious Diseases Faculty  Pager: [phone]  ________________________________________________________________________    Interval history:  Bryan Bell is a 36 y.o. male  Afebrile. Cr increased once again. Taking PO. Sig abd/stomach pain. +nausea and emesis; twice today. In bed. Still with loose stool. No f/c/ns, cough, HA, SOB.                ABXClement Bell, zosyn --> linezolid 11/08/17  PO vanc 11/09/17    Medications  Scheduled Meds:    ascorbic acid (VITAMIN C) tablet 500 mg 500 mg Oral QDAY   enoxaparin (LOVENOX) syringe 40 mg 40 mg Subcutaneous QDAY(21)   insulin aspart U-100 (NOVOLOG FLEXPEN) injection PEN 0-7 Units 0-7 Units Subcutaneous ACHS   insulin aspart U-100 (NOVOLOG FLEXPEN) injection PEN 16 Units 16 Units Subcutaneous TID w/ meals   insulin glargine (LANTUS SOLOSTAR, BASAGLAR) injection PEN 40 Units 40 Units Subcutaneous QHS(22)   linezolid (ZYVOX) tablet 600 mg 600 mg Oral BID   polyethylene glycol 3350 (MIRALAX) packet 17 g 1 packet Oral BID   senna/docusate (SENOKOT-S) tablet 2 tablet 2 tablet Oral BID   vancomycin [facility]) oral solution 125 mg 125 mg Oral QID   vitamins, multi w/minerals tablet 1 tablet 1 tablet Oral QDAY   zinc sulfate capsule 220 mg 220 mg Oral QDAY Continuous Infusions:    PRN and Respiratory Meds:acetaminophen Q6H PRN, diphenhydrAMINE Q6H PRN, HYDROcodone/acetaminophen Q6H PRN, morphine  injection syringe Q6H PRN, ondansetron (ZOFRAN) IV Q6H PRN, traZODone QHS PRN, vitamin A & D PRN      Objective                       Vital Signs: Last Filed                 Vital Signs: 24 Hour Range   BP: 128/63 (11/21 2313)  Temp: 36.9 ???C (98.4 ???F) (11/21 2313)  Pulse: 80 (11/21 2313)  Respirations: 18 PER MINUTE (11/21 2313)  SpO2: 99 % (11/21 2313)  O2 Delivery: None (Room Air) (11/21 2313) BP: (128-148)/(63-85)   Temp:  [36.7 ???C (98 ???F)-36.9 ???C (98.4 ???F)]   Pulse:  [79-87]   Respirations:  [16 PER MINUTE-18 PER MINUTE]   SpO2:  [98 %-100 %]   O2 Delivery: None (Room Air)   Intensity Pain Scale (Self Report): 8 (11/11/17 0610) Vitals:    11/02/17 1743 11/03/17 0000   Weight: 99.8 kg (220 lb) 90.5 kg (199 lb 9.6 oz)  Physical Exam  Gen: resting, arousable, no acute distress  HEENT:  EOMI, no subconjunctival hemorrhages  CV: RRR  Resp: Clear bilat, air exchange good   Abd, soft, non tender, +BS  Extremity: L BKA, RLE dressed  Derm: no rash/lesions appreciated          Lab Review  Full labs reviewed.     Recent Labs      11/11/17   0424   WBC  9.9           Radiology and other Diagnostics Review: Radiology viewed and reports reviewed    11/04/17 MRI:  IMPRESSION  1. ???Soft tissue ulceration overlying the lateral head of the fibula   without evidence of osteomyelitis.    2. ???Skin thickening, edema and enhancement of the subcutaneous soft   tissues of the leg, nonspecific, but can be seen in setting of cellulitis.   No drainable soft tissue fluid collection is identified.    3. ???Mild edema and enhancement within the multiple proximal leg muscles,   suggestive of myositis, as described above. No drainable intramuscular   fluid collection.        Tobi Bastos, MD

## 2017-11-11 NOTE — Progress Notes
Progress Note - Medicine      Today's Date:  11/10/2017  Date of Admission: 11/02/2017  5:59 PM  HD # LOS: 8 days    Name: Bryan Bell                DOB: Nov 10, 1981         Age: 36 y.o.       MRN:  [id number]         Assessment/Plan:     Active Problems:    Wound, open, back    Uncontrolled type 2 diabetes mellitus with hyperglycemia, with long-term current use of insulin (HCC)    Hx of BKA, left (HCC)    Homelessness    Sepsis due to methicillin resistant Staphylococcus aureus (MRSA) (HCC)    Medical non-compliance    Severe malnutrition (HCC)      ====================================================================================================    Bryan Bell is a 36 y.o. 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  RLE wound  - ESR = 73  - no ostial destruction on the plain films   - MRI w/ soft tissue ulcer, no evidence of osteomyelitis c/w cellulitis possible myositis   - ortho consulted -status post I&D and wound VAC on 11/17  - ID consulted  - wound culture with MRSA and enterococcus   - blood cx NGTD  PLAN  - change to linezolid due to AKI per ID   - cont current pain control, but will need to be titrated prior to discharge d/t lack of outside prescriber   - will need wound vac d/c prior to discharge as no insurance. Discussed with wound nurse, would like to have wound vac on for at least a week, two weeks preferable     Acute kidney injury   - secondary to vanc  - d/c vanc  - 1L LR  - d/c vanc   - urine lytes    Cdiff +  - agree with ID, not sure if this represents true infection as he was previously constipated and was receiving lots of bowel medication which was likely source of his diarrhea.   - will go ahead and treat   ???  H/o multiple MRSA wounds  - cont CHG baths, mupirocin to nares  ???  Uncontrolled DM2  - lantus 40, aspart 16  - [facility] - diabetes RN consulted  - patient receives insulin through [facility] and has medications covered     Constipation - resolved  - continue bowel regimen  ???  FEN: no IVF, replace lytes PRN, ADA diet  Ppx: lovenox  Code: Full  ???  Dispo: discharge to home once off IV antibiotics and wound vac discontinued       Crista Elliot, MD  Med Private D- [id number]      __________________________________________________________________________________  Subjective:  No acute overnight events. Still with pain in the leg. Denies fevers or chills. Still having abdominal pain the RLQ and some nausea. No vomiting.       Objective:  Medications:  Scheduled Meds:    ascorbic acid (VITAMIN C) tablet 500 mg 500 mg Oral QDAY   enoxaparin (LOVENOX) syringe 40 mg 40 mg Subcutaneous QDAY(21)   insulin aspart U-100 (NOVOLOG FLEXPEN) injection PEN 0-7 Units 0-7 Units Subcutaneous ACHS   insulin aspart U-100 (NOVOLOG FLEXPEN) injection PEN 16 Units 16 Units Subcutaneous  TID w/ meals   insulin glargine (LANTUS SOLOSTAR, BASAGLAR) injection PEN 40 Units 40 Units Subcutaneous QHS(22)   linezolid (ZYVOX) tablet 600 mg 600 mg Oral BID   polyethylene glycol 3350 (MIRALAX) packet 17 g 1 packet Oral BID   senna/docusate (SENOKOT-S) tablet 2 tablet 2 tablet Oral BID   vancomycin [facility]) oral solution 125 mg 125 mg Oral QID   vitamins, multi w/minerals tablet 1 tablet 1 tablet Oral QDAY   zinc sulfate capsule 220 mg 220 mg Oral QDAY   Continuous Infusions:    PRN and Respiratory Meds:acetaminophen Q6H PRN, diphenhydrAMINE Q6H PRN, HYDROcodone/acetaminophen Q6H PRN, morphine  injection syringe Q6H PRN, ondansetron (ZOFRAN) IV Q6H PRN, traZODone QHS PRN, vitamin A & D PRN       Vitals:                Vital Signs:  Last Filed             Vital Signs:  24 Hour Range   BP: 128/63 (11/21 2313)  Temp: 36.9 ???C (98.4 ???F) (11/21 2313)  Pulse: 80 (11/21 2313)  Respirations: 18 PER MINUTE (11/21 2313)  SpO2: 99 % (11/21 2313) O2 Delivery: None (Room Air) (11/21 2313)  BP: (128-148)/(63-85)   Temp:  [36.7 ???C (98 ???F)-36.9 ???C (98.4 ???F)]   Pulse:  [79-87]   Respirations:  [16 PER MINUTE-18 PER MINUTE]   SpO2:  [98 %-100 %]   O2 Delivery: None (Room Air)   Intensity Pain Scale (Self Report): (not recorded) Vitals:    11/02/17 1743 11/03/17 0000   Weight: 99.8 kg (220 lb) 90.5 kg (199 lb 9.6 oz)       Wt Readings from Last 1 Encounters:   11/03/17 90.5 kg (199 lb 9.6 oz)       PIPP Score      FLACC         Intake/Output Summary :  (Last 24 hours)    Intake/Output Summary (Last 24 hours) at 11/10/17 2359  Last data filed at 11/10/17 2313   Gross per 24 hour   Intake             2358 ml   Output             4675 ml   Net            -2317 ml       Physical Exam:  Body mass index is 27.07 kg/m???.  Constitutional: Alert, no distress  Head: Normocephalic, atraumatic  Eyes: Sclera anicteric  Heart: Regular rate and rhythm, no murmur  Lungs: Clear to auscultation bilaterally  Abdomen: Soft, nontender, normal active bowel sounds  Extremities: Right lower extremity with dressing in place, C/D/I  Neuro: Nonfocal    Lab Review  Recent Labs      11/08/17   1152  11/09/17   0522  11/10/17   0448   NA  141  138  139   K  4.0  4.1  3.9   CL  107  105  106   CO2  27  27  26    GAP  7  6  7    BUN  18  18  19    CR  1.36*  1.60*  1.75*   GLU  101*  125*  196*   CA  8.9  8.8  8.8   ALBUMIN  3.2*  3.1*  3.0*       Recent Labs      11/08/17  1152  11/09/17   0522  11/10/17   0448   WBC  8.3  9.4  8.0   HGB  10.8*  9.9*  9.8*   HCT  32.3*  29.3*  29.7*   PLTCT  380  358  316   AST  33  21  14   ALT  29  21  11    ALKPHOS  100  91  79      Estimated Creatinine Clearance: 75.4 mL/min (A) (based on SCr of 1.75 mg/dL (H)).  Vitals:    11/02/17 1743 11/03/17 0000   Weight: 99.8 kg (220 lb) 90.5 kg (199 lb 9.6 oz)    No results for input(s): PHART, PO2ART in the last 72 hours.    Invalid input(s): PC02A      Point of Care Testing  (Last 24 hours) Glucose: (!) 196 (11/10/17 0448)  POC Glucose (Download): (!) 189 (11/10/17 2158)    Radiology and other Diagnostics Review:    Pertinent radiology reviewed.

## 2017-11-11 NOTE — Progress Notes
General Progress Note    Name:  Bryan Bell   AVWUJ'W Date:  11/11/2017  Admission Date: 11/02/2017  LOS: 9 days                     Assessment/Plan:    Active Problems:    Wound, open, back    Uncontrolled type 2 diabetes mellitus with hyperglycemia, with long-term current use of insulin (HCC)    Hx of BKA, left (HCC)    Homelessness    Sepsis due to methicillin resistant Staphylococcus aureus (MRSA) (HCC)    Medical non-compliance    Severe malnutrition [facility])    Bryan Bell is a 36 y.o. 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  RLE wound  - ESR = 73  - no ostial destruction on the plain films   - MRI w/ soft tissue ulcer, no evidence of osteomyelitis c/w cellulitis possible myositis   - ortho consulted -status post I&D and wound VAC on 11/17  - ID consulted  - wound culture with MRSA and enterococcus   - blood cx NGTD  PLAN  - change to linezolid due to AKI per ID   - cont current pain control, but will need to be titrated off prior to discharge d/t lack of outside prescriber   - will need wound vac d/c prior to discharge as no insurance. Per wound nurse, would like to have wound vac on for at least a week, two weeks preferable. Plan to evaluate tomorrow    ???  Acute kidney injury   - secondary to vanc  - d/c vanc  - creatinine trending up. Continues to have good urine output.   - will monitor renal function. Will consult nephrology if continues to trend up      Cdiff +  - agree with ID, not sure if this represents true infection as he was previously constipated and was receiving lots of bowel medication which was likely source of his diarrhea.   - was started on po vanc. Will continue with plan for 10 days     Nausea/vomiting   - intermittent.   - constipation has resolved although still has some stool on kub. - concern possible gastroparesis with uncontrolled DM  - discussed about minimizing narcotics - agrees to taper off  - if persisting, will trial reglan    ???  H/o multiple MRSA wounds  - cont CHG baths, mupirocin to nares  ???  Uncontrolled DM2  - lantus 40, aspart 16  - [facility]  - diabetes RN consulted  - patient receives insulin through [facility] and has medications covered   ???  FEN: no IVF, replace lytes PRN, ADA diet  Ppx: lovenox  Code: Full  Dispo: continue inpatient care. Discharge plan complicated by patient uninsured and homeless.    ________________________________________________________________________    Subjective  Bryan Bell is a 36 y.o. male. No acute events overnight. He reports nausea and emesis which has been going on since his surgery. He also reports some abdominal cramping.  He still has pain at the wound site.  Ros; denies fever, chills, chest pain or sob. Had BM yesterday. No diarrhea.     Medications  Scheduled Meds:  ascorbic acid (VITAMIN C) tablet 500 mg 500 mg Oral QDAY   enoxaparin (LOVENOX) syringe 40 mg 40 mg Subcutaneous  QDAY(21)   insulin aspart U-100 (NOVOLOG FLEXPEN) injection PEN 0-7 Units 0-7 Units Subcutaneous ACHS   insulin aspart U-100 (NOVOLOG FLEXPEN) injection PEN 16 Units 16 Units Subcutaneous TID w/ meals   insulin glargine (LANTUS SOLOSTAR, BASAGLAR) injection PEN 40 Units 40 Units Subcutaneous QHS(22)   linezolid (ZYVOX) tablet 600 mg 600 mg Oral BID   polyethylene glycol 3350 (MIRALAX) packet 17 g 1 packet Oral BID   senna/docusate (SENOKOT-S) tablet 2 tablet 2 tablet Oral BID   vancomycin [facility]) oral solution 125 mg 125 mg Oral QID   vitamins, multi w/minerals tablet 1 tablet 1 tablet Oral QDAY   zinc sulfate capsule 220 mg 220 mg Oral QDAY   Continuous Infusions:  PRN and Respiratory Meds:acetaminophen Q6H PRN, diphenhydrAMINE Q6H PRN, HYDROcodone/acetaminophen Q6H PRN, morphine  injection syringe Q6H PRN, ondansetron (ZOFRAN) IV Q6H PRN, traZODone QHS PRN, vitamin A & D PRN       Objective:                          Vital Signs: Last Filed                 Vital Signs: 24 Hour Range   BP: 130/64 (11/22 0840)  Temp: 36.7 ???C (98.1 ???F) (11/22 0840)  Pulse: 84 (11/22 0840)  Respirations: 18 PER MINUTE (11/22 0840)  SpO2: 99 % (11/22 0840)  O2 Delivery: None (Room Air) (11/22 0840) BP: (128-148)/(63-85)   Temp:  [36.7 ???C (98 ???F)-36.9 ???C (98.4 ???F)]   Pulse:  [79-87]   Respirations:  [16 PER MINUTE-18 PER MINUTE]   SpO2:  [98 %-100 %]   O2 Delivery: None (Room Air)   Intensity Pain Scale (Self Report): 8 (11/11/17 0610) Vitals:    11/02/17 1743 11/03/17 0000   Weight: 99.8 kg (220 lb) 90.5 kg (199 lb 9.6 oz)       Intake/Output Summary:  (Last 24 hours)    Intake/Output Summary (Last 24 hours) at 11/11/17 0936  Last data filed at 11/11/17 1610   Gross per 24 hour   Intake             2198 ml   Output             4025 ml   Net            -1827 ml      Stool Occurrence: 1    Physical Exam  General:  Alert & cooperative, NAD  Head:  Normocephalic, atraumatic  Eyes:  Conjunctiva clear.  PERRL, EOMI.    Neck:  Supple, symmetrical, no adenopathy, no JVD  Lungs:  Clear to auscultation in all lung fields; no rhonchi, wheezing, or rales  Heart:   RRR, no murmur, rub, click, or gallop  Abdomen:  Soft, mild tenderness in epigastric area and left lower quadrant   Extremities: Right lower extremity with dressing in place, left bka.  I  Pulses:  2+ and symmetric, all extremities  Neuro:  No focal deficits.     Lab Review  24-hour labs:    Results for orders placed or performed during the hospital encounter of 11/02/17 (from the past 24 hour(s))   CREATININE-URINE RANDOM    Collection Time: 11/10/17  5:05 PM   Result Value Ref Range    Creatinine, Random 28 MG/DL   SODIUM-URINE RANDOM    Collection Time: 11/10/17  5:05 PM   Result Value Ref Range    Sodium, Random 56  MMOL/L   POC GLUCOSE    Collection Time: 11/10/17  6:48 PM Result Value Ref Range    Glucose, POC 125 (H) 70 - 100 MG/DL   POC GLUCOSE    Collection Time: 11/10/17  9:58 PM   Result Value Ref Range    Glucose, POC 189 (H) 70 - 100 MG/DL   CBC AND DIFF    Collection Time: 11/11/17  4:24 AM   Result Value Ref Range    White Blood Cells 9.9 4.5 - 11.0 K/UL    RBC 3.74 (L) 4.4 - 5.5 M/UL    Hemoglobin 10.2 (L) 13.5 - 16.5 GM/DL    Hematocrit 16.1 (L) 40 - 50 %    MCV 83.7 80 - 100 FL    MCH 27.2 26 - 34 PG    MCHC 32.6 32.0 - 36.0 G/DL    RDW 09.6 11 - 15 %    Platelet Count 360 150 - 400 K/UL    MPV 8.1 7 - 11 FL    Neutrophils 78 (H) 41 - 77 %    Lymphocytes 14 (L) 24 - 44 %    Monocytes 7 4 - 12 %    Eosinophils 1 0 - 5 %    Basophils 0 0 - 2 %    Absolute Neutrophil Count 7.80 (H) 1.8 - 7.0 K/UL    Absolute Lymph Count 1.40 1.0 - 4.8 K/UL    Absolute Monocyte Count 0.70 0 - 0.80 K/UL    Absolute Eosinophil Count 0.10 0 - 0.45 K/UL    Absolute Basophil Count 0.00 0 - 0.20 K/UL   COMPREHENSIVE METABOLIC PANEL    Collection Time: 11/11/17  4:24 AM   Result Value Ref Range    Sodium 139 137 - 147 MMOL/L    Potassium 4.4 3.5 - 5.1 MMOL/L    Chloride 104 98 - 110 MMOL/L    Glucose 249 (H) 70 - 100 MG/DL    Blood Urea Nitrogen 21 7 - 25 MG/DL    Creatinine 0.45 (H) 0.4 - 1.24 MG/DL    Calcium 9.1 8.5 - 40.9 MG/DL    Total Protein 6.8 6.0 - 8.0 G/DL    Total Bilirubin 0.2 (L) 0.3 - 1.2 MG/DL    Albumin 3.3 (L) 3.5 - 5.0 G/DL    Alk Phosphatase 92 25 - 110 U/L    AST (SGOT) 12 7 - 40 U/L    CO2 27 21 - 30 MMOL/L    ALT (SGPT) 11 7 - 56 U/L    Anion Gap 8 3 - 12    eGFR Non African American 35 (L) >60 mL/min    eGFR African American 42 (L) >60 mL/min   POC GLUCOSE    Collection Time: 11/11/17  8:43 AM   Result Value Ref Range    Glucose, POC 242 (H) 70 - 100 MG/DL   POC GLUCOSE    Collection Time: 11/11/17 11:51 AM   Result Value Ref Range    Glucose, POC 188 (H) 70 - 100 MG/DL       Point of Care Testing  (Last 24 hours)  Glucose: (!) 249 (11/11/17 0424) POC Glucose (Download): (!) 242 (11/11/17 8119)    Radiology and other Diagnostics Review:    Pertinent radiology reviewed.    Sandi Mariscal, MD   Pager [phone]

## 2017-11-11 NOTE — Progress Notes
Pt complaining of 10/10 abdominal pain. Pt states "it feels like I'm really full, but I haven't eaten." Pain unrelieved with prn medications, see eMAR. Pt vomited after norco was administered. Paged MP on-call. Orders for 1x dose of oxycodone. Will continue to monitor.

## 2017-11-12 LAB — POC GLUCOSE
Lab: 145 mg/dL — ABNORMAL HIGH (ref 70–100)
Lab: 198 mg/dL — ABNORMAL HIGH (ref 70–100)
Lab: 209 mg/dL — ABNORMAL HIGH (ref 70–100)
Lab: 67 mg/dL — ABNORMAL LOW (ref 70–100)
Lab: 77 mg/dL (ref 70–100)

## 2017-11-12 LAB — COMPREHENSIVE METABOLIC PANEL: Lab: 137 MMOL/L — ABNORMAL LOW (ref 137–147)

## 2017-11-12 LAB — CBC AND DIFF: Lab: 8.8 K/UL — ABNORMAL LOW (ref >60)

## 2017-11-12 NOTE — Progress Notes
General Progress Note    Name:  Bryan Bell   ZOXWR'U Date:  11/12/2017  Admission Date: 11/02/2017  LOS: 10 days                     Assessment/Plan:    Active Problems:    Wound, open, back    Uncontrolled type 2 diabetes mellitus with hyperglycemia, with long-term current use of insulin (HCC)    Hx of BKA, left (HCC)    Homelessness    Sepsis due to methicillin resistant Staphylococcus aureus (MRSA) (HCC)    Medical non-compliance    Severe malnutrition [facility])    Mr. Demarrio Menges is a 36 y.o. 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  RLE wound  - ESR = 73  - no ostial destruction on the plain films   - MRI w/ soft tissue ulcer, no evidence of osteomyelitis c/w cellulitis possible myositis   - ortho consulted -status post I&D and wound VAC on 11/17  - ID consulted  - wound culture with MRSA and enterococcus   - blood cx NGTD  PLAN  - change to linezolid due to AKI per ID - plan to treat 7 days post op. Last day tomorrow   - tapering off of narcotics.  D/c morphine today. Continue hydrocodone   - will need wound vac d/c prior to discharge as no insurance. Per wound nurse, would like to have wound vac on for at least a week, two weeks preferable. Wound vanc changed today. Will discuss with ortho if ok to d/c wound vac tomorrow   ???  Acute kidney injury - improving   - secondary to vanc  - d/c vanc  - creatinine peaked at 2.16 - now trending down   - will monitor renal function. Will consult nephrology if continues to trend up      Cdiff +  - agree with ID, not sure if this represents true infection as he was previously constipated and was receiving lots of bowel medication which was likely source of his diarrhea.   - was started on po vanc. Will continue with plan for 10 days     Nausea/vomiting   - intermittent. - constipation has resolved although still has some stool on kub. Having daily BMs   - concern possible gastroparesis with uncontrolled DM  - discussed about minimizing narcotics - tapering off   - if further vomiting, will add Reglan     ???  H/o multiple MRSA wounds  - cont CHG baths, mupirocin to nares  ???  Uncontrolled DM2  - lantus 40, aspart 16  - [facility]  - diabetes RN consulted  - patient receives insulin through [facility] and has medications covered   ???  FEN: no IVF, replace lytes PRN, ADA diet  Ppx: lovenox  Code: Full  Dispo: continue inpatient care. Discharge plan complicated by patient uninsured and homeless.     I spent 40 minute in patient care. > 50% of the time was spent in evaluating patient, discussing discharge plan and coordinating care.    ________________________________________________________________________    Subjective  Bryan Bell is a 36 y.o. male. No acute events overnight. Has not had emesis this morning. He is upset today because he he does not want to taper his narcotics. Explained to him will not be prescribing narcotics  at discharge due to no outpatient prescribe. He states that his pcp at [facility] will prescribe narcotics but wont feel there. I asked him how he plans to fill his medication as he has mentioned that he has no money previously.  Patient upset that he will not have narcotics. I explained again that he likely has gastroparesis and narcotics are going to worsen his abdominal pain/nausea/vomiting.   Did not want to participate with further evaluation.     Medications  Scheduled Meds:    ascorbic acid (VITAMIN C) tablet 500 mg 500 mg Oral QDAY   enoxaparin (LOVENOX) syringe 40 mg 40 mg Subcutaneous QDAY(21)   insulin aspart U-100 (NOVOLOG FLEXPEN) injection PEN 0-7 Units 0-7 Units Subcutaneous ACHS   insulin aspart U-100 (NOVOLOG FLEXPEN) injection PEN 16 Units 16 Units Subcutaneous TID w/ meals   insulin glargine (LANTUS SOLOSTAR, BASAGLAR) injection PEN 40 Units 40 Units Subcutaneous QHS(22)   linezolid (ZYVOX) tablet 600 mg 600 mg Oral BID   polyethylene glycol 3350 (MIRALAX) packet 17 g 1 packet Oral BID   senna/docusate (SENOKOT-S) tablet 2 tablet 2 tablet Oral BID   vancomycin [facility]) oral solution 125 mg 125 mg Oral QID   vitamins, multi w/minerals tablet 1 tablet 1 tablet Oral QDAY   zinc sulfate capsule 220 mg 220 mg Oral QDAY   Continuous Infusions:  PRN and Respiratory Meds:acetaminophen Q6H PRN, diphenhydrAMINE Q6H PRN, HYDROcodone/acetaminophen Q6H PRN, morphine  injection syringe Q6H PRN, ondansetron (ZOFRAN) IV Q6H PRN, traZODone QHS PRN, vitamin A & D PRN       Objective:                          Vital Signs: Last Filed                 Vital Signs: 24 Hour Range   BP: 149/83 (11/22 2342)  Temp: 36.7 ???C (98 ???F) (11/22 2342)  Pulse: 84 (11/22 2342)  Respirations: 18 PER MINUTE (11/22 2342)  SpO2: 98 % (11/22 2342)  O2 Delivery: None (Room Air) (11/23 0240) BP: (130-149)/(64-83)   Temp:  [36.6 ???C (97.9 ???F)-36.7 ???C (98.1 ???F)]   Pulse:  [73-84]   Respirations:  [18 PER MINUTE]   SpO2:  [97 %-100 %]   O2 Delivery: None (Room Air)   Intensity Pain Scale (Self Report): 10 (11/12/17 0240)  Intensity Pain Scale (Self Report): 9 (11/11/17 1330) Vitals:    11/02/17 1743 11/03/17 0000   Weight: 99.8 kg (220 lb) 90.5 kg (199 lb 9.6 oz)       Intake/Output Summary:  (Last 24 hours)    Intake/Output Summary (Last 24 hours) at 11/12/17 1610  Last data filed at 11/12/17 0300   Gross per 24 hour   Intake             1920 ml   Output             2875 ml   Net             -955 ml      Stool Occurrence: 1    Physical Exam  Did not participate with exam due to above discussion.     Lab Review  24-hour labs:    Results for orders placed or performed during the hospital encounter of 11/02/17 (from the past 24 hour(s))   POC GLUCOSE    Collection Time: 11/11/17  8:43 AM   Result Value Ref Range  Glucose, POC 242 (H) 70 - 100 MG/DL   POC GLUCOSE    Collection Time: 11/11/17 11:51 AM Result Value Ref Range    Glucose, POC 188 (H) 70 - 100 MG/DL   POC GLUCOSE    Collection Time: 11/11/17  5:26 PM   Result Value Ref Range    Glucose, POC 136 (H) 70 - 100 MG/DL   POC GLUCOSE    Collection Time: 11/11/17  8:27 PM   Result Value Ref Range    Glucose, POC 198 (H) 70 - 100 MG/DL   CBC AND DIFF    Collection Time: 11/12/17  2:45 AM   Result Value Ref Range    White Blood Cells 8.8 4.5 - 11.0 K/UL    RBC 3.87 (L) 4.4 - 5.5 M/UL    Hemoglobin 10.4 (L) 13.5 - 16.5 GM/DL    Hematocrit 16.1 (L) 40 - 50 %    MCV 83.6 80 - 100 FL    MCH 27.0 26 - 34 PG    MCHC 32.3 32.0 - 36.0 G/DL    RDW 09.6 (H) 11 - 15 %    Platelet Count 367 150 - 400 K/UL    MPV 7.8 7 - 11 FL    Neutrophils 71 41 - 77 %    Lymphocytes 21 (L) 24 - 44 %    Monocytes 7 4 - 12 %    Eosinophils 1 0 - 5 %    Basophils 0 0 - 2 %    Absolute Neutrophil Count 6.20 1.8 - 7.0 K/UL    Absolute Lymph Count 1.80 1.0 - 4.8 K/UL    Absolute Monocyte Count 0.60 0 - 0.80 K/UL    Absolute Eosinophil Count 0.10 0 - 0.45 K/UL    Absolute Basophil Count 0.00 0 - 0.20 K/UL   COMPREHENSIVE METABOLIC PANEL    Collection Time: 11/12/17  2:45 AM   Result Value Ref Range    Sodium 137 137 - 147 MMOL/L    Potassium 5.0 3.5 - 5.1 MMOL/L    Chloride 102 98 - 110 MMOL/L    Glucose 174 (H) 70 - 100 MG/DL    Blood Urea Nitrogen 21 7 - 25 MG/DL    Creatinine 0.45 (H) 0.4 - 1.24 MG/DL    Calcium 9.2 8.5 - 40.9 MG/DL    Total Protein 7.2 6.0 - 8.0 G/DL    Total Bilirubin 0.3 0.3 - 1.2 MG/DL    Albumin 3.3 (L) 3.5 - 5.0 G/DL    Alk Phosphatase 74 25 - 110 U/L    AST (SGOT) 24 7 - 40 U/L    CO2 28 21 - 30 MMOL/L    ALT (SGPT) 13 7 - 56 U/L    Anion Gap 7 3 - 12    eGFR Non African American 38 (L) >60 mL/min    eGFR African American 46 (L) >60 mL/min       Point of Care Testing  (Last 24 hours)  Glucose: (!) 174 (11/12/17 0245)  POC Glucose (Download): (!) 198 (11/11/17 2027)    Radiology and other Diagnostics Review:    Pertinent radiology reviewed.    Sandi Mariscal, MD Pager [phone]

## 2017-11-12 NOTE — Progress Notes
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 MWF wound changes, WV changed today  DVT Prophylaxis: Mechanical, Chemoprophylaxis: per primary      Farrel ConnersMatthew J Zayin Valadez, MD  [phone]  ----------------------------------------------------------------------------------------------------------    S: No acute events.  Pain well-controlled on current regimen.    O:  Blood pressure 139/81, pulse 74, temperature 36.6 C (97.9 F), height 182.9 cm (72"), weight 90.5 kg (199 lb 9.6 oz), SpO2 100 %.   General: AAOx3, NAD  Cardiac: RRR  Respirations: Unlabored  Abdomen: soft, nt  Extremities: right lower extremity compartments soft, wiggles toes , + ankle pf df, SILT, distal cap refill < 2 sec.   Incisions:  Clean dry and intact. WV in place          No results for input(s): PTT, INR in the last 72 hours.    CBC w/Diff    Lab Results   Component Value Date/Time    WBC 8.8 11/12/2017 02:45 AM    HGB 10.4 (L) 11/12/2017 02:45 AM    HCT 32.3 (L) 11/12/2017 02:45 AM    PLTCT 367 11/12/2017 02:45 AM            Basic Metabolic Profile    Lab Results   Component Value Date/Time    NA 137 11/12/2017 02:45 AM    K 5.0 11/12/2017 02:45 AM    CL 102 11/12/2017 02:45 AM    CO2 28 11/12/2017 02:45 AM    GAP 7 11/12/2017 02:45 AM    Lab Results   Component Value Date/Time    BUN 21 11/12/2017 02:45 AM    CR 2.02 (H) 11/12/2017 02:45 AM    GLU 174 (H) 11/12/2017 02:45 AM

## 2017-11-13 LAB — POC GLUCOSE
Lab: 121 mg/dL — ABNORMAL HIGH (ref 70–100)
Lab: 80 mg/dL (ref 70–100)
Lab: 316 mg/dL — ABNORMAL HIGH (ref 70–100)
Lab: 124 mg/dL — ABNORMAL HIGH (ref 70–100)
Lab: 244 mg/dL — ABNORMAL HIGH (ref 70–100)

## 2017-11-13 NOTE — Case Management (ED)
Weekend SW spoke with MD, and pt not stable to d/c until next week.  Primary SW to f/u about med voucher next week.  Weekend report updated    Gurney MaxinAutumn Rene Sizelove, LMSW, [facility]  Social Work  Pager: [phone]

## 2017-11-13 NOTE — Progress Notes
PCA attempted to take patient's vital signs. When attempting blood pressure, patient said loudly "next time do what you have to do and don't wake me up." PCA explained to patient that she was checking on him to make sure he was okay. Patient then took of blood pressure cuff and through it across the room. Will continue to monitor and attempt blood pressure at a later time.

## 2017-11-13 NOTE — Progress Notes
General Progress Note    Name:  Bryan Bell   ZOXWR'U Date:  11/13/2017  Admission Date: 11/02/2017  LOS: 11 days                     Assessment/Plan:    Active Problems:    Wound, open, back    Uncontrolled type 2 diabetes mellitus with hyperglycemia, with long-term current use of insulin (HCC)    Hx of BKA, left (HCC)    Homelessness    Sepsis due to methicillin resistant Staphylococcus aureus (MRSA) (HCC)    Medical non-compliance    Severe malnutrition [facility])    [name] is a 36 y.o. 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  RLE wound  - ESR = 73  - no ostial destruction on the plain films   - MRI w/ soft tissue ulcer, no evidence of osteomyelitis c/w cellulitis possible myositis   - ortho consulted -status post I&D and wound VAC on 11/17  - ID consulted  - wound culture with MRSA and enterococcus   - blood cx NGTD  PLAN  - change to linezolid due to AKI per ID - plan to treat 7 days post op. Last day today   - tapering off of narcotics.  D/cd morphine . Continue hydrocodone   - will need wound vac d/c prior to discharge as no insurance. Per wound nurse, would like to have wound vac on for at least a week, two weeks preferable. Wound vanc changed 11/24. Plan to change again   ???  Acute kidney injury - improving   - secondary to vanc  - d/c vanc  - creatinine peaked at 2.16 - now trending down.  Will follow labs in am (none today)   - will monitor renal function.       Cdiff +  - agree with ID, not sure if this represents true infection as he was previously constipated and was receiving lots of bowel medication which was likely source of his diarrhea.   - was started on po vanc. Will continue with plan for 10 days     Nausea/vomiting   - intermittent.   - constipation has resolved although still has some stool on kub. Having daily BMs - concern possible gastroparesis with uncontrolled DM  - discussed about minimizing narcotics - tapering off   - if further vomiting, will add Reglan     ???  H/o multiple MRSA wounds  - cont CHG baths, mupirocin to nares  ???  Uncontrolled DM2  - lantus 40, aspart 16  - [facility]  - diabetes RN consulted  - patient receives insulin through [facility] and has medications covered   - one episode of hypoglycemia yesterday.  Will monitor on current dose. If still low will adjust   ???  FEN: no IVF, replace lytes PRN, ADA diet  Ppx: lovenox  Code: Full  Dispo: continue inpatient care. Discharge plan complicated by patient uninsured and homeless. Discussed with ortho today.  Plan to change wound vanc on Monday and evaluate wound to decide how much longer he needs this      ________________________________________________________________________    Subjective  Bryan Bell is a 36 y.o. male. No acute events overnight.  Has not had vomiting. Patient very irritable this morning. Did not want to have a conversation.  Kept saying just discharge me, I don't care what you all are doing.  Discussed with him will discharge him once wound vac is off.  And discussed again that no plan to discharge on narcotics.       Medications  Scheduled Meds:    ascorbic acid (VITAMIN C) tablet 500 mg 500 mg Oral QDAY   enoxaparin (LOVENOX) syringe 40 mg 40 mg Subcutaneous QDAY(21)   insulin aspart U-100 (NOVOLOG FLEXPEN) injection PEN 0-7 Units 0-7 Units Subcutaneous ACHS   insulin aspart U-100 (NOVOLOG FLEXPEN) injection PEN 16 Units 16 Units Subcutaneous TID w/ meals   insulin glargine (LANTUS SOLOSTAR, BASAGLAR) injection PEN 40 Units 40 Units Subcutaneous QHS(22)   linezolid (ZYVOX) tablet 600 mg 600 mg Oral BID   polyethylene glycol 3350 (MIRALAX) packet 17 g 1 packet Oral BID   senna/docusate (SENOKOT-S) tablet 2 tablet 2 tablet Oral BID   vancomycin [facility]) oral solution 125 mg 125 mg Oral QID vitamins, multi w/minerals tablet 1 tablet 1 tablet Oral QDAY   zinc sulfate capsule 220 mg 220 mg Oral QDAY   Continuous Infusions:  PRN and Respiratory Meds:acetaminophen Q6H PRN, diphenhydrAMINE Q6H PRN, HYDROcodone/acetaminophen Q6H PRN, ondansetron (ZOFRAN) IV Q6H PRN, traZODone QHS PRN, vitamin A & D PRN       Objective:                          Vital Signs: Last Filed                 Vital Signs: 24 Hour Range   BP: 132/77 (11/24 0740)  Temp: 36.6 ???C (97.8 ???F) (11/24 0740)  Pulse: 81 (11/24 0740)  Respirations: 17 PER MINUTE (11/24 0740)  SpO2: 100 % (11/24 0740)  O2 Delivery: None (Room Air) (11/24 0740) BP: (115-156)/(60-88)   Temp:  [36.6 ???C (97.8 ???F)-36.6 ???C (97.9 ???F)]   Pulse:  [80-81]   Respirations:  [16 PER MINUTE-18 PER MINUTE]   SpO2:  [98 %-100 %]   O2 Delivery: None (Room Air)   Intensity Pain Scale (Self Report): Asleep (11/13/17 0000) Vitals:    11/02/17 1743 11/03/17 0000   Weight: 99.8 kg (220 lb) 90.5 kg (199 lb 9.6 oz)       Intake/Output Summary:  (Last 24 hours)    Intake/Output Summary (Last 24 hours) at 11/13/17 0830  Last data filed at 11/13/17 0500   Gross per 24 hour   Intake             2260 ml   Output             4000 ml   Net            -1740 ml      Stool Occurrence: 1    Physical Exam  Did not participate with exam due to above discussion.     Lab Review  24-hour labs:    Results for orders placed or performed during the hospital encounter of 11/02/17 (from the past 24 hour(s))   POC GLUCOSE    Collection Time: 11/12/17 12:01 PM   Result Value Ref Range    Glucose, POC 67 (L) 70 - 100 MG/DL   POC GLUCOSE    Collection Time: 11/12/17 12:21 PM   Result Value Ref Range    Glucose, POC 77 70 - 100 MG/DL   POC GLUCOSE    Collection Time: 11/12/17  5:47 PM   Result Value Ref Range  Glucose, POC 209 (H) 70 - 100 MG/DL   POC GLUCOSE    Collection Time: 11/12/17  8:49 PM   Result Value Ref Range    Glucose, POC 121 (H) 70 - 100 MG/DL   POC GLUCOSE Collection Time: 11/13/17  7:43 AM   Result Value Ref Range    Glucose, POC 80 70 - 100 MG/DL       Point of Care Testing  (Last 24 hours)  POC Glucose (Download): 80 (11/13/17 0743)    Radiology and other Diagnostics Review:    Pertinent radiology reviewed.    Sandi Mariscal, MD   Pager [phone]

## 2017-11-14 LAB — POC GLUCOSE
Lab: 199 mg/dL — ABNORMAL HIGH (ref 70–100)
Lab: 226 mg/dL — ABNORMAL HIGH (ref 70–100)
Lab: 230 mg/dL — ABNORMAL HIGH (ref 70–100)
Lab: 306 mg/dL — ABNORMAL HIGH (ref 70–100)
Lab: 54 mg/dL — ABNORMAL LOW (ref 70–100)
Lab: 73 mg/dL (ref 70–100)

## 2017-11-14 LAB — CBC: Lab: 7.7 K/UL — ABNORMAL LOW (ref >60)

## 2017-11-14 LAB — COMPREHENSIVE METABOLIC PANEL: Lab: 132 MMOL/L — ABNORMAL LOW (ref >60)

## 2017-11-14 MED ORDER — BUPROPION XL 150 MG PO TB24
150 mg | Freq: Every day | ORAL | 0 refills | Status: DC
Start: 2017-11-14 — End: 2017-11-20
  Administered 2017-11-14 – 2017-11-19 (×4): 150 mg via ORAL

## 2017-11-14 NOTE — Progress Notes
Patient care assumed at 0700.  Patient refusing assessment and medications at this time.  Alert and oriented x4, wound vac in place.  Will continue to monitor and encourage medications.  Pt agreeable to insulin dose with breakfast.

## 2017-11-14 NOTE — Progress Notes
General Progress Note    Name:  Bryan Bell   ZOXWR'U Date:  11/14/2017  Admission Date: 11/02/2017  LOS: 12 days                     Assessment/Plan:    Active Problems:    Wound, open, back    Uncontrolled type 2 diabetes mellitus with hyperglycemia, with long-term current use of insulin (HCC)    Hx of BKA, left (HCC)    Homelessness    Sepsis due to methicillin resistant Staphylococcus aureus (MRSA) (HCC)    Medical non-compliance    Severe malnutrition [facility])    [name] is a 36 y.o. 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  RLE wound  - ESR = 73  - no ostial destruction on the plain films   - MRI w/ soft tissue ulcer, no evidence of osteomyelitis c/w cellulitis possible myositis   - ortho consulted -status post I&D and wound VAC on 11/17  - ID consulted  - wound culture with MRSA and enterococcus   - blood cx NGTD  PLAN  - change to linezolid due to AKI per ID - plan to treat 7 days post op. Last day 11/24  - tapering off of narcotics.  D/cd morphine . Continue hydrocodone   - will need wound vac d/c prior to discharge as no insurance. Per wound nurse, would like to have wound vac on for at least a week, two weeks preferable. Wound vanc changed 11/24. Plan to change again   ???  Acute kidney injury - improving   - secondary to vanc  - d/c vanc  - creatinine peaked at 2.16 - now trending down.  Will follow labs in am (none today)   - will monitor renal function.       Cdiff +  - agree with ID, not sure if this represents true infection as he was previously constipated and was receiving lots of bowel medication which was likely source of his diarrhea.   - was started on po vanc. Will continue with plan for 10 days     Nausea/vomiting - resolved   - intermittent.   - constipation has resolved although still has some stool on kub. Having daily BMs   - concern possible gastroparesis with uncontrolled DM  - discussed about minimizing narcotics - tapering off   - if further vomiting, will add Reglan     ???  H/o multiple MRSA wounds  - cont CHG baths, mupirocin to nares  ???  Uncontrolled DM2  - lantus 40, aspart 16  - [facility]  - diabetes RN consulted  - patient receives insulin through [facility] and has medications covered   ???  FEN: no IVF, replace lytes PRN, ADA diet  Ppx: lovenox  Code: Full  Dispo: continue inpatient care. Discharge plan complicated by patient uninsured and homeless. Discussed with ortho  Plan to change wound vanc on Monday and evaluate wound to decide how much longer he needs this     I spent 35 minute in patient care.  50% of the time was sent in evaluating patient and coordinating care.   ________________________________________________________________________    Subjective  Bryan Bell is a 36 y.o. male. No acute events overnight.  No further emesis.  Having regular bm. Still reports pain at surgical  site. cam and cooperative today. No other concern or complaint.     ROS: denies fever, chills, chest pain, diarrhea     Medications  Scheduled Meds:    ascorbic acid (VITAMIN C) tablet 500 mg 500 mg Oral QDAY   enoxaparin (LOVENOX) syringe 40 mg 40 mg Subcutaneous QDAY(21)   insulin aspart U-100 (NOVOLOG FLEXPEN) injection PEN 0-7 Units 0-7 Units Subcutaneous ACHS   insulin aspart U-100 (NOVOLOG FLEXPEN) injection PEN 16 Units 16 Units Subcutaneous TID w/ meals   insulin glargine (LANTUS SOLOSTAR, BASAGLAR) injection PEN 40 Units 40 Units Subcutaneous QHS(22)   linezolid (ZYVOX) tablet 600 mg 600 mg Oral BID   polyethylene glycol 3350 (MIRALAX) packet 17 g 1 packet Oral BID   senna/docusate (SENOKOT-S) tablet 2 tablet 2 tablet Oral BID   vancomycin [facility]) oral solution 125 mg 125 mg Oral QID   vitamins, multi w/minerals tablet 1 tablet 1 tablet Oral QDAY   zinc sulfate capsule 220 mg 220 mg Oral QDAY   Continuous Infusions: PRN and Respiratory Meds:acetaminophen Q6H PRN, HYDROcodone/acetaminophen Q6H PRN, ondansetron (ZOFRAN) IV Q6H PRN, traZODone QHS PRN, vitamin A & D PRN       Objective:                          Vital Signs: Last Filed                 Vital Signs: 24 Hour Range   BP: 129/68 (11/25 0730)  Temp: 36.6 ???C (97.9 ???F) (11/25 0730)  Pulse: 80 (11/25 0730)  Respirations: 18 PER MINUTE (11/25 0730)  SpO2: 99 % (11/25 0730)  O2 Delivery: None (Room Air) (11/25 0730) BP: (113-130)/(68-84)   Temp:  [36.3 ???C (97.4 ???F)-36.6 ???C (97.9 ???F)]   Pulse:  [80-103]   Respirations:  [16 PER MINUTE-18 PER MINUTE]   SpO2:  [99 %-100 %]   O2 Delivery: None (Room Air)   Intensity Pain Scale (Self Report): 10 (11/13/17 2108) Vitals:    11/02/17 1743 11/03/17 0000   Weight: 99.8 kg (220 lb) 90.5 kg (199 lb 9.6 oz)       Intake/Output Summary:  (Last 24 hours)    Intake/Output Summary (Last 24 hours) at 11/14/17 0981  Last data filed at 11/14/17 0732   Gross per 24 hour   Intake             2078 ml   Output             2700 ml   Net             -622 ml      Stool Occurrence: 1    Physical Exam  General:  Alert & cooperative, NAD  Head:  Normocephalic, atraumatic  Eyes:  Conjunctiva clear.  PERRL, EOMI.    Neck:  Supple, symmetrical, no adenopathy, no JVD  Lungs:  Clear to auscultation in all lung fields; no rhonchi, wheezing, or rales  Heart:   RRR, no murmur, rub, click, or gallop  Abdomen:  Soft, mild tenderness in epigastric area and left lower quadrant   Extremities: Right lower extremity with dressing in place, left bka.  I  Pulses:  2+ and symmetric, all extremities  Neuro:  No focal deficits.     Lab Review  24-hour labs:    Results for orders placed or performed during the hospital encounter of 11/02/17 (from the past 24 hour(s))   POC GLUCOSE  Collection Time: 11/13/17 11:59 AM   Result Value Ref Range    Glucose, POC 316 (H) 70 - 100 MG/DL   POC GLUCOSE    Collection Time: 11/13/17  3:26 PM   Result Value Ref Range Glucose, POC 124 (H) 70 - 100 MG/DL   POC GLUCOSE    Collection Time: 11/13/17  5:25 PM   Result Value Ref Range    Glucose, POC 244 (H) 70 - 100 MG/DL   POC GLUCOSE    Collection Time: 11/13/17  8:52 PM   Result Value Ref Range    Glucose, POC 199 (H) 70 - 100 MG/DL   POC GLUCOSE    Collection Time: 11/13/17 11:50 PM   Result Value Ref Range    Glucose, POC 306 (H) 70 - 100 MG/DL   CBC    Collection Time: 11/14/17  4:16 AM   Result Value Ref Range    White Blood Cells 7.7 4.5 - 11.0 K/UL    RBC 4.22 (L) 4.4 - 5.5 M/UL    Hemoglobin 11.1 (L) 13.5 - 16.5 GM/DL    Hematocrit 29.5 (L) 40 - 50 %    MCV 81.9 80 - 100 FL    MCH 26.4 26 - 34 PG    MCHC 32.3 32.0 - 36.0 G/DL    RDW 28.4 11 - 15 %    Platelet Count 409 (H) 150 - 400 K/UL    MPV 7.5 7 - 11 FL   COMPREHENSIVE METABOLIC PANEL    Collection Time: 11/14/17  4:16 AM   Result Value Ref Range    Sodium 132 (L) 137 - 147 MMOL/L    Potassium 5.0 3.5 - 5.1 MMOL/L    Chloride 99 98 - 110 MMOL/L    Glucose 338 (H) 70 - 100 MG/DL    Blood Urea Nitrogen 30 (H) 7 - 25 MG/DL    Creatinine 1.32 (H) 0.4 - 1.24 MG/DL    Calcium 9.0 8.5 - 44.0 MG/DL    Total Protein 7.2 6.0 - 8.0 G/DL    Total Bilirubin 0.2 (L) 0.3 - 1.2 MG/DL    Albumin 3.5 3.5 - 5.0 G/DL    Alk Phosphatase 85 25 - 110 U/L    AST (SGOT) 13 7 - 40 U/L    CO2 26 21 - 30 MMOL/L    ALT (SGPT) 11 7 - 56 U/L    Anion Gap 7 3 - 12    eGFR Non African American 45 (L) >60 mL/min    eGFR African American 54 (L) >60 mL/min   POC GLUCOSE    Collection Time: 11/14/17  7:34 AM   Result Value Ref Range    Glucose, POC 226 (H) 70 - 100 MG/DL       Point of Care Testing  (Last 24 hours)  Glucose: (!) 338 (11/14/17 0416)  POC Glucose (Download): (!) 226 (11/14/17 1027)    Radiology and other Diagnostics Review:    Pertinent radiology reviewed.    Sandi Mariscal, MD   Pager [phone]

## 2017-11-14 NOTE — Progress Notes
Pt left unit via personal wheelchair to smoke.  Smoking waiver in chart.

## 2017-11-15 LAB — POC GLUCOSE
Lab: 104 mg/dL — ABNORMAL HIGH (ref 70–100)
Lab: 238 mg/dL — ABNORMAL HIGH (ref 70–100)
Lab: 72 mg/dL (ref >60)
Lab: 103 mg/dL — ABNORMAL HIGH (ref 70–100)
Lab: 212 mg/dL — ABNORMAL HIGH (ref 70–100)

## 2017-11-15 LAB — CBC: Lab: 7.9 K/UL — ABNORMAL LOW (ref 4.5–11.0)

## 2017-11-15 LAB — COMPREHENSIVE METABOLIC PANEL: Lab: 137 MMOL/L — ABNORMAL LOW (ref 137–147)

## 2017-11-15 NOTE — Progress Notes
CLINICAL NUTRITION                                                        Clinical Nutrition Follow-Up Summary     NAME:Bryan Bell             MRN: [id number]             DOB:Jun 27, 1981          AGE: 36 y.o.  ADMISSION DATE: 11/02/2017             DAYS ADMITTED: LOS: 13 days    Nutrition Assessment of Patient:  Malnutrition Assessment: Malnutrition present  Current Oral Intake: Improving  Estimated Calorie Needs: 1990-2262 kcal (22-25 kcal/kg present weight 90.5kg)  Estimated Protein Needs: 100-113gm (1.1-1.25gm/kg present weight 90.5kg)  Oral Diet Order: Diabetic Carb Counting (no carb limit)  Oral Supplement: Boost Glucose Control, BID     ICD-10 code E43: Chronic illness/Severe malnutrition        Weight loss: > 7.5% x 3 months (Energy intake: 50% of less of est. energy needs for 1 month )  Loss of Subcutaneous Fat: No      Muscle Wasting: Yes Moderate Temple, Clavicle, Scapula, Deltoid  Malnutrition Interventions: Continue diabetic diet with no carb restriction, Encourage intake of high protein sources with all meals/snacks, Boost Glucose control supplements PRN    Comments:  36 yo male with PMH of DM2, left BKA in 2012, COPD, HLD, HTN, depression, anxiety who presents today with R leg pain/wounds. Pt is being followed by clinical nutrition services given presence of stage II foot pressure injury as well as malnutrition (dx by previous RD 11/14). Pt is s/p I&D and wound vac placed 11/17 w/ change on 11/24.  He is homeless; a list of food banks was provided to pt during initial RD visit. Pt reported continued nausea and intermittent vomiting during stay. He has experienced intolerance with boost supplements and does not plan to drink them anymore.  Pt has been trying to maintain an adequate meal intake with high protein sources. RD encouraged continued po intake efforts of high protein sources. +BM 11/25, -26.3L net I/O since admit, and RLE trace edema per documentation.     Recommendation: ??? Recommend continue diabetic diet with no carb restriction  ??? Continue to assist pt with ordering high protein, balanced meals  ??? Boost glucose control supplements PRN                                Intervention / Plan:  Will continue to monitor PO intake adequacy/tolerance  Will monitor pts labs, meds, wt, gi function, i/os, and need for further nutrition intervention       Nutrition Diagnosis:  Inadequate oral intake  Etiology: Decreased ability to consume sufficient energy  Signs & Symptoms: Decreased appetite d/t pain, limited access to resources d/t homelessness                      Goals:  Patient to consume >75% of meals/supplements  Time Frame: Throughout Stay  Status: Partially met;new goal established  Patient to consume >75% of meals  Time Frame: Throughout Stay       Marca Ancona RD, LD [phone]

## 2017-11-15 NOTE — Case Management (ED)
Case Management Progress Note    NAME:Bryan Bell                          MRN: [id number]              DOB:Sep 30, 1981          AGE: 36 y.o.  ADMISSION DATE: 11/02/2017             DAYS ADMITTED: LOS: 13 days      Todays Date: 11/15/2017    Plan  Pt plans to d/c to the street (declined shelter) [facility]      Interventions  ? Support      ? Info or Referral      ? Discharge Planning    Pt will be unable to d/c with Wound Vac in place.  Pt is uninsured and Homeless and pt to be on Wet to Dry dressings at d/c.  Pt will require PO Vanco until 12-3 and will need med voucher.  PO Zyvox has been completed 11-26  SW aware  Pt may need Cab Voucher for transport.  Pt nervous about losing leg if wound doesn't heal.  Pt seems to have money to smoke, and 4x4 and hypofix tape is modestly priced.  No therapy recommendations at this time.  No other CM needs identified at this time  NCm will continue to assess and monitor for changes        ? Medication Needs   Medication Needs: Other  ? Financial   Financial: [employer] Referral/Follow-up, Financial Counseling Referral/Follow-up  ? Legal      ? Other        Disposition  ? Expected Discharge Date    Expected Discharge Date: 11/13/17  ? Transportation   Does the patient need discharge transport arranged?: Yes  Transportation Name, Phone and Availability #1: bus pass  Does the patient use Medicaid Transportation?: No  ? Next Level of Care (Acute Psych discharges only)      ? Discharge Disposition                                          Durable Medical Equipment     No service has been selected for the patient.      [facility]     No service has been selected for the patient.      [facility]     No service has been selected for the patient.       Dialysis/Infusion     No service has been selected for the patient.          Zollie BeckersKurt Wyndi Northrup RN-NCM  Case Management Department  [phone]  [phone]

## 2017-11-15 NOTE — Progress Notes
Ortho Update:    Discussed wound with wound care team, wound image viewed via wound care teams note.   Discussed with Dr. Cherene JulianHeddings, anticipate wound vac changes for an additional 2-4 weeks.    Karrie DoffingA. Boyd Litaker, APRN, FNP-C,   Ortho-Trauma Nurse Practitioner, p.[phone] ([facility], 7-4)

## 2017-11-15 NOTE — Progress Notes
Wound Ostomy Note    NAME:Bryan Bell                                                                   MRN: [id number]                 DOB:05/12/1981          AGE: 35 y.o.  ADMISSION DATE: 11/02/2017             DAYS ADMITTED: LOS: 13 days      Reason for Consult/Visit: wound VAC    Assessment/Plan:    Active Problems:    Wound, open, back    Uncontrolled type 2 diabetes mellitus with hyperglycemia, with long-term current use of insulin (HCC)    Hx of BKA, left (HCC)    Homelessness    Sepsis due to methicillin resistant Staphylococcus aureus (MRSA) (HCC)    Medical non-compliance    Severe malnutrition (HCC)      Wounds (NOT for Pressure Injuries) 11/03/17 0026 Right;Outer;Lower Leg Ulcer (not from pressure) (Active)   11/03/17 0026 Leg   Wound Orientation: Right;Outer;Lower   Wound Type: Ulcer (not from pressure)   Wound Type::    Wound Description (Comments):    Wound Image   11/15/2017 12:00 PM   Agree With My Assessment? Yes 11/14/2017  3:15 PM   Wound Base Assessment Pink;Moist;Granulation;Pale;Yellow 11/15/2017 12:00 PM   Surrounding Skin Assessment Intact 11/15/2017 12:00 PM   Wound Site Closure Wound Adhesive Bandage 11/15/2017 12:00 PM   Wound Drainage Amount Scant 11/15/2017 12:00 PM   Wound Drainage Description Serosanguineous 11/15/2017 12:00 PM   Wound Dressing Status Changed 11/15/2017 12:00 PM   Wound Dressing and / or Treatment VAC sponge - black;VAC @ -125 mm/Hg 11/15/2017 12:00 PM   Wound Length (cm) 3.9 cm 11/15/2017 12:00 PM   Wound Width (cm) 3.5 cm 11/15/2017 12:00 PM   Wound Depth (cm) 0.2 cm 11/15/2017 12:00 PM   Wound Surface Area (cm^2) 13.65 cm^2 11/15/2017 12:00 PM   Wound Volume (cm^3) 2.73 cm^3 11/15/2017 12:00 PM   Wound Healing % (Wound Team Only) 62.08 11/15/2017 12:00 PM   Wound Status (Wound Team Only) Being Treated 11/15/2017 12:00 PM   Number of days: 12       Procedure: Negative Wound Therapy Dressing Change    Anesthesia Type: Not Applicable or None Provider(s) and Role: RN    Negative Wound Pressure Device Type: KCI VAC    Contact layer: NA    Number of sponges in the wound prior to dressing change: no label    Number of sponges removed from the wound: 1    Type of sponge: Black foam    Number of sponges placed in the wound: 1    Wound base with pink, moist granulation tissue; small area of yellow tissue in center of wound (see pic above).      **Pt states he doesn't want to discharge because he is nervous about losing his leg if this wound doesn't heal.    Wound base cleaned with saline and gauze, black VAC foam applied.  Dressing connected to wound VAC with settings of - continuous.  Next dressing change planned for Wed 11/28.    Will continue to follow.  [name], RN, BSN, [employer], [employer]  Wound Ostomy Nursing Consult Service  Office: [phone]  Pager: [phone]  After Hours Wound/Ostomy Team Pager: [phone]

## 2017-11-16 LAB — POC GLUCOSE
Lab: 79 mg/dL (ref 70–100)
Lab: 240 mg/dL — ABNORMAL HIGH (ref 70–100)
Lab: 61 mg/dL — ABNORMAL LOW (ref 70–100)
Lab: 86 mg/dL (ref 70–100)
Lab: 221 mg/dL — ABNORMAL HIGH (ref 70–100)

## 2017-11-16 NOTE — Progress Notes
General Progress Note    Name:  Bryan Bell   ZOXWR'U Date:  11/16/2017  Admission Date: 11/02/2017  LOS: 14 days                     Assessment/Plan:    Active Problems:    Wound, open, back    Uncontrolled type 2 diabetes mellitus with hyperglycemia, with long-term current use of insulin (HCC)    Hx of BKA, left (HCC)    Homelessness    Sepsis due to methicillin resistant Staphylococcus aureus (MRSA) (HCC)    Medical non-compliance    Severe malnutrition [facility])    [name] is a 36 y.o. 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  RLE wound  - ESR = 73  - no ostial destruction on the plain films   - MRI w/ soft tissue ulcer, no evidence of osteomyelitis c/w cellulitis possible myositis   - ortho consulted -status post I&D and wound VAC on 11/17  - ID consulted  - wound culture with MRSA and enterococcus   - blood cx NGTD  PLAN  - change to linezolid due to AKI per ID -  Completed 7 days post op. Last day 11/24  - tapering off of narcotics.  D/cd morphine . Continue hydrocodone   - will need wound vac d/c prior to discharge as no insurance. Per Ortho, wound need wound vac for at least another 2 weeks. Unable to discharge while wound vac is on.    ???  Acute kidney injury - improving   - secondary to vanc  - d/c vanc  - creatinine peaked at 2.16 - now trending down.   - will monitor renal function.       Cdiff +  - agree with ID, not sure if this represents true infection as he was previously constipated and was receiving lots of bowel medication which was likely source of his diarrhea.   - was started on po vanc. Will continue with plan for 10 days     Nausea/vomiting - resolved   - intermittent.   - constipation has resolved although still has some stool on kub. Having daily BMs   - concern possible gastroparesis with uncontrolled DM - discussed about minimizing narcotics - tapering off   - if further vomiting, will add Reglan     ???  H/o multiple MRSA wounds  - cont CHG baths, mupirocin to nares  ???  Uncontrolled DM2  - lantus 40, aspart 16  - [facility]  - diabetes RN consulted  - patient receives insulin through [facility] and has medications covered   ???  FEN: no IVF, replace lytes PRN, ADA diet  Ppx: lovenox  Code: Full  Dispo: continue inpatient care. Discharge plan complicated by patient uninsured and homeless. Per Ortho, wound need wound vac for at least another 2 weeks. Unable to discharge while wound vac is on.  Social work/ncm to assist if other options are available.     Change lab to Monday and Thursday   ________________________________________________________________________    Subjective  Bryan Bell is a 36 y.o. male. No acute events overnight.  No new change in symptoms. Discussed again we will not be doing IV pain mediations.     Medications  Scheduled Meds:    ascorbic acid (VITAMIN C) tablet 500 mg  500 mg Oral QDAY   buPROPion XL (WELLBUTRIN XL) tablet 150 mg 150 mg Oral QDAY   enoxaparin (LOVENOX) syringe 40 mg 40 mg Subcutaneous QDAY(21)   insulin aspart U-100 (NOVOLOG FLEXPEN) injection PEN 0-7 Units 0-7 Units Subcutaneous ACHS   insulin aspart U-100 (NOVOLOG FLEXPEN) injection PEN 16 Units 16 Units Subcutaneous TID w/ meals   insulin glargine (LANTUS SOLOSTAR, BASAGLAR) injection PEN 40 Units 40 Units Subcutaneous QHS(22)   polyethylene glycol 3350 (MIRALAX) packet 17 g 1 packet Oral BID   senna/docusate (SENOKOT-S) tablet 2 tablet 2 tablet Oral BID   vancomycin [facility]) oral solution 125 mg 125 mg Oral QID   vitamins, multi w/minerals tablet 1 tablet 1 tablet Oral QDAY   zinc sulfate capsule 220 mg 220 mg Oral QDAY   Continuous Infusions:  PRN and Respiratory Meds:acetaminophen Q6H PRN, HYDROcodone/acetaminophen Q6H PRN, ondansetron (ZOFRAN) IV Q6H PRN, traZODone QHS PRN, vitamin A & D PRN       Objective: Vital Signs: Last Filed                 Vital Signs: 24 Hour Range   BP: 120/65 (11/26 2110)  Temp: 36.7 ???C (98.1 ???F) (11/26 2110)  Pulse: 91 (11/26 2110)  Respirations: 18 PER MINUTE (11/26 2110)  SpO2: 100 % (11/26 2110)  O2 Delivery: None (Room Air) (11/26 2110) BP: (120-126)/(65-88)   Temp:  [36.3 ???C (97.4 ???F)-36.7 ???C (98.1 ???F)]   Pulse:  [59-91]   Respirations:  [16 PER MINUTE-18 PER MINUTE]   SpO2:  [98 %-100 %]   O2 Delivery: None (Room Air)   Intensity Pain Scale (Self Report): 10 (11/15/17 2059)  Intensity Pain Scale (Self Report): 10 (11/15/17 0954) Vitals:    11/02/17 1743 11/03/17 0000   Weight: 99.8 kg (220 lb) 90.5 kg (199 lb 9.6 oz)       Intake/Output Summary:  (Last 24 hours)    Intake/Output Summary (Last 24 hours) at 11/16/17 0838  Last data filed at 11/16/17 0730   Gross per 24 hour   Intake              960 ml   Output             2940 ml   Net            -1980 ml      Stool Occurrence: 1    Physical Exam  General:  Alert & cooperative, NAD  Head:  Normocephalic, atraumatic  Eyes:  Conjunctiva clear.  PERRL, EOMI.    Neck:  Supple, symmetrical, no adenopathy, no JVD  Lungs:  Clear to auscultation in all lung fields; no rhonchi, wheezing, or rales  Heart:   RRR, no murmur, rub, click, or gallop  Abdomen:  Soft, mild tenderness in epigastric area and left lower quadrant   Extremities: Right lower extremity with dressing in place, left bka.  I  Pulses:  2+ and symmetric, all extremities  Neuro:  No focal deficits.     Lab Review  24-hour labs:    Results for orders placed or performed during the hospital encounter of 11/02/17 (from the past 24 hour(s))   POC GLUCOSE    Collection Time: 11/15/17  8:39 AM   Result Value Ref Range    Glucose, POC 238 (H) 70 - 100 MG/DL   POC GLUCOSE    Collection Time: 11/15/17 12:19 PM   Result Value Ref Range    Glucose, POC 72 70 - 100 MG/DL  POC GLUCOSE    Collection Time: 11/15/17 12:42 PM   Result Value Ref Range Glucose, POC 103 (H) 70 - 100 MG/DL   POC GLUCOSE    Collection Time: 11/15/17  5:07 PM   Result Value Ref Range    Glucose, POC 212 (H) 70 - 100 MG/DL   POC GLUCOSE    Collection Time: 11/15/17  8:59 PM   Result Value Ref Range    Glucose, POC 79 70 - 100 MG/DL       Point of Care Testing  (Last 24 hours)  POC Glucose (Download): 79 (11/15/17 2059)    Radiology and other Diagnostics Review:    Pertinent radiology reviewed.    Sandi Mariscal, MD   Pager [phone]

## 2017-11-17 LAB — POC GLUCOSE
Lab: 388 mg/dL — ABNORMAL HIGH (ref 70–100)
Lab: 204 mg/dL — ABNORMAL HIGH (ref 70–100)
Lab: 181 mg/dL — ABNORMAL HIGH (ref 70–100)
Lab: 94 mg/dL (ref 70–100)
Lab: 261 mg/dL — ABNORMAL HIGH (ref 70–100)

## 2017-11-17 MED ORDER — INSULIN ASPART 100 UNIT/ML SC FLEXPEN
0-14 [IU] | Freq: Before meals | SUBCUTANEOUS | 0 refills | Status: DC
Start: 2017-11-17 — End: 2017-11-20

## 2017-11-17 NOTE — Case Management (ED)
Case Management Progress Note    NAME:Bryan Bell                          MRN: [id number]              DOB:01-23-1981          AGE: 36 y.o.  ADMISSION DATE: 11/02/2017             DAYS ADMITTED: LOS: 15 days      Todays Date: 11/17/2017    Plan  Pt to d/c when medically stable and W/v is changed to Wet-to-Dry dressing      Interventions  ? Support      ? Info or Referral      ? Discharge Planning    NCm and Sw attended LOS meeting  Physician Advisors are willing to speak with Dr Cherene JulianHeddings about transitioning the pt to wet to dry dressing off the W/V.  ORtho is suggesting another 2-4 weeks of W/v, pt was to be off W/v 11-24 and on wet to dry dressings for d/c on 11-26  Pt is homeless and uninsured.  Medicaid app being submitted  PO Vanco until 11-30.    Otherwise all ABx completed.  No therapy recommendations at this time.  No other CM needs identified at this time  NCm will continue to assess and monitor for changes      ? Medication Needs   Medication Needs: Other  ? Financial   Financial: Mining engineerCardon Referral/Follow-up, Financial Counseling Referral/Follow-up  ? Legal      ? Other        Disposition  ? Expected Discharge Date    Expected Discharge Date: 11/19/17  ? Transportation   Does the patient need discharge transport arranged?: Yes  Transportation Name, Phone and Availability #1: bus pass  Does the patient use Medicaid Transportation?: No  ? Next Level of Care (Acute Psych discharges only)      ? Discharge Disposition                                          Durable Medical Equipment     No service has been selected for the patient.      [facility] [facility]     No service has been selected for the patient.      [facility]     No service has been selected for the patient.      [facility]     No service has been selected for the patient.          Zollie BeckersKurt Lorri Fukuhara RN-NCM  Case Management Department  [phone]  [phone]

## 2017-11-17 NOTE — Progress Notes
Wound Ostomy Note    NAME:Bryan Bell                                                                   MRN: [id number]                 DOB:07/08/1981          AGE: 36 y.o.  ADMISSION DATE: 11/02/2017             DAYS ADMITTED: LOS: 15 days      Reason for Consult/Visit: wound VAC    Assessment/Plan:    Active Problems:    Wound, open, back    Uncontrolled type 2 diabetes mellitus with hyperglycemia, with long-term current use of insulin (HCC)    Hx of BKA, left (HCC)    Homelessness    Sepsis due to methicillin resistant Staphylococcus aureus (MRSA) (HCC)    Medical non-compliance    Severe malnutrition (HCC)      Wounds (NOT for Pressure Injuries) 11/03/17 0026 Right;Outer;Lower Leg Ulcer (not from pressure) (Active)   11/03/17 0026 Leg   Wound Orientation: Right;Outer;Lower   Wound Type: Ulcer (not from pressure)   Wound Type::    Wound Description (Comments):    Wound Image   11/15/2017 12:00 PM   Agree With My Assessment? Yes 11/17/2017  4:20 AM   Wound Base Assessment Red;Moist;Granulation;Pale;Yellow 11/17/2017 10:30 AM   Surrounding Skin Assessment Intact 11/17/2017 10:30 AM   Wound Site Closure Wound Adhesive Bandage 11/17/2017 10:30 AM   Wound Drainage Amount Scant 11/17/2017 10:30 AM   Wound Drainage Description Serosanguineous 11/17/2017 10:30 AM   Wound Dressing Status Changed 11/17/2017 10:30 AM   Wound Dressing and / or Treatment VAC @ -125 mm/Hg;VAC sponge - black 11/17/2017 10:30 AM     Procedure: Negative Wound Therapy Dressing Change  ???  Anesthesia Type: Not Applicable or None  ???  Provider(s) and Role: RN  ???  Negative Wound Pressure Device Type: KCI VAC  ???  Contact layer: NA  ???  Number of sponges in the wound prior to dressing change: 1  ???  Number of sponges removed from the wound: 1  ???  Type of sponge: Black foam  ???  Number of sponges placed in the wound: 1  ???  Wound base with red, moist granulation tissue; small area of yellow tissue in center of wound.    ??? Wound base cleaned with saline and gauze, black VAC foam applied.  Dressing connected to wound VAC with settings of - continuous.  Next dressing change planned for Fri 11/30.  ???  Will continue to follow.  ???  [name], RN, BSN, [employer], [employer]  Wound Ostomy Nursing Consult Service  Office: [phone]  Pager: [phone]  After Hours Wound/Ostomy Team Pager: [phone]  ???

## 2017-11-18 LAB — POC GLUCOSE
Lab: 128 mg/dL — ABNORMAL HIGH (ref 70–100)
Lab: 154 mg/dL — ABNORMAL HIGH (ref 70–100)
Lab: 192 mg/dL — ABNORMAL HIGH (ref 70–100)

## 2017-11-18 LAB — COMPREHENSIVE METABOLIC PANEL: Lab: 136 MMOL/L — ABNORMAL LOW (ref >60)

## 2017-11-18 LAB — CBC: Lab: 7.8 K/UL — ABNORMAL HIGH (ref >60)

## 2017-11-18 MED ORDER — HYDROCODONE-ACETAMINOPHEN 5-325 MG PO TAB
1-2 | ORAL | 0 refills | Status: DC | PRN
Start: 2017-11-18 — End: 2017-11-20
  Administered 2017-11-19 (×2): 2 via ORAL

## 2017-11-18 NOTE — Discharge Planning (AHS/AVS)
EMERGENCY SHELTER PROGRAMS  ? [facility]  [address]  [place], [place]        [phone]  Cold weather shelter (low for the day forecasted to be 20 degrees or below).  Call after 1pm to see if the shelter will be open that day.    ? [facility]  [address]  [place], [place]       [phone]  Men???s Shelter Only. Check-in starts at 4:00pm. Shelter has over 300 beds. Men must leave shelter during the day.    ? [facility]/FAMILY [facility]LTER  [address] [place] [address], [place]       [phone]  Must go through [facility] for availability. Married families or men or women with children.    ? [facility]  [address]  [place], [place]      [phone]  Church answers phone during day-ask for shelter. Only families with children.  Must be willing to participate in 60 day program for finding employment and permanent housing.     ? [facility]  [address]  [place], [place]       [phone]  Single women and women with children. 30 day shelter and must call daily for bed availability. Must leave shelter during the day and check in at 6:00pm.    ? [facility]  [address]  [place], [place]       [phone]  Doors open at 9:00 pm and close at 10:00 pm; exit from the shelter is at 7:00 am.  No reservations to make and Freida Busman stated that they will not turn anyone away due to lack of space.  He states when someone shows up (during the specified hours) they will be admitted.  He expressed that they will take anyone (even if inebriated or under the influence of substances) as long as they are not disruptive.  They have spaces for Men, Women and families.  They only provide snacks; they are working on a day center but no meals at this time.  There are no fees and no residency requirements. They are NOT affiliated with the homeless hotline so you don???t need to call the hotline if you want to send some to [place]. ? [facility]  [address]  [place], [place]       [phone]  Men???s Shelter Only. Offers meal, shower, and basic health screenings.     ? [facility]  [address]  [place], [place]      [phone]  Men???s Shelter Only. Offers 24-hour emergency shelter, 90-day transitional housing program, transportation assistance, housing referrals, health screening, job search assistance, mental health counseling, utility assistance, nutrition classes, mentoring and bible study.            HOTLINE FOR THE HOMELESS: [phone]    NON-EMERGENCY SHELTER PROGRAMS  ? [facility]  [address]  [place], [place]      [phone]  Church answers phone during day-ask for shelter. Only families with children.  Must be willing to participate in 60 day program for finding employment & permanent housing.     ? [facility]  [address]  [place], [place]       [phone]  Open Daily, 8:30 a.m. to 2:30 p.m.  Operated through [facility]. Some of the services offered include coffee, laundry facilities, shower facilities,  telephone, computer, mail delivery, clothing closet, homeless case management. Learn housing options and connections to [facility] for mental health services, if needed.  ? [facility]  Serves homeless families, singles and youth (including teens & young adults 16-20).  In exchange for rent and utility-free housing, adults are required by written agreement to find full-time employment, obey program guidelines and attend weekly volunteer-taught classes in life skills, employment, community living and budgeting.  [facility] may provide transitional housing clients with car repair/donation, food, medical assistance, dental work, GED classes, haircuts, glasses, school clothes, work Ambulance person, Catering manager.  Dance movement psychotherapist also offers non-residential housing programming and services.  Clients must be literally homeless to qualify.    Program Locations:  ? [place], [place]          [phone]  ? [place], [place]         [phone]  ? [place], [place]         [phone]  ? [place], [place] & [place], [place] (Family/Adult & Rapid Re-Housing)   [phone]  ? [address] [place] (Youth)        [phone]  ? [place], [place] (Rapid Re-Housing)       [phone]  ? [place], [place] (Rapid Re-Housing)       [phone]    ? [facility]  [address] [place]       [phone]  Not an emergency shelter. Must go through project breakthrough [phone]. Mainly for families with children, but will sometimes make exceptions. Serves meals to public Mon-Thurs 7:30am-8:30am for breakfast and Wed, Sat & Sun 4:00pm-7:30pm for dinner.    ? [facility]  [address]  [place], [place]        [phone]  Not an emergency shelter. Family shelter only. Must have children. Couples must be married. Call during 8:30-4:30 to start application process for three month program.    ? [facility]  [address]  [place], [place]       [phone]  Not an emergency shelter. Program for women with addiction and mental illnesses.        ? [facility]  [facility] Community Care programs provide assistance and services in the five county area for families in emergency situations.  [facility] three Community Care locations ??? in [place], [place] and [place] ??? feed hungry people, provide articles of clothing, blankets for the cold weather and fans during the summer's heat. Assistance includes keeping people's lights on, their houses warm and roofs over their heads, helping families with utility bills and housing bills (rent, deposits, mortgages, etc.)    Community Care program locations:  Business hours are 8:30am-5:00pm  ? [place]: [address], [place]        [phone] ? [place]: [address]  [place], [place]        [phone]  ? [place]: [address]  [place], [place]         [phone]    ? [facility]  [address] [place], [place]        [phone]   Not an emergency shelter.  Can apply for assistance with housing from 10am ??? 12:30pm Mon-Fri.     ? [facility]  [address]  [place], [place]      [phone]  Not an emergency shelter.  Transitional Housing program for families.  Program includes case managers,  life-skills coaching, mentoring, and personalized financial plans.

## 2017-11-18 NOTE — Progress Notes
Called MPD regarding wound vac off. On call stated would be ok to leave off until primary team sees pt this AM. Informed primary RN.

## 2017-11-18 NOTE — Progress Notes
Pt refusing fall bundle. Pt educated on importance of allowing staff to assist with transfers to and from chair to bed. Pt receptive to teaching and continuing to respectfully decline. Consulting civil engineerCharge RN notified. Will continue to monitor.

## 2017-11-18 NOTE — Progress Notes
General Progress Note    Name:  Bryan Bell   ZOXWR'[id number] Date:  11/18/2017  Admission Date: 11/02/2017  LOS: 16 days                     Assessment/Plan:    Active Problems:    Wound, open, back    Uncontrolled type 2 diabetes mellitus with hyperglycemia, with long-term current use of insulin (HCC)    Hx of BKA, left (HCC)    Homelessness    Sepsis due to methicillin resistant Staphylococcus aureus (MRSA) (HCC)    Medical non-compliance    Severe malnutrition [facility])    [name] is a 36 y.o. 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  RLE wound  - ESR = 73  - no ostial destruction on the plain films   - MRI w/ soft tissue ulcer, no evidence of osteomyelitis c/w cellulitis possible myositis   - ortho consulted -status post I&D and wound VAC on 11/17  - ID consulted  - wound culture with MRSA and enterococcus   - blood cx NGTD  PLAN  - change to linezolid due to AKI per ID -  Completed 7 days post op. Last day 11/24  - tapering off of narcotics.  D/cd morphine . Continue hydrocodone  - wound vac has been discontinued. Unclear by whom. Patient said surgery team discontinued it. Awaiting to hear from ortho   ???  Acute kidney injury - improving   - secondary to vanc  - d/c vanc  - creatinine peaked at 2.16 - now trending down.   - will monitor renal function.       Cdiff +  - agree with ID, not sure if this represents true infection as he was previously constipated and was receiving lots of bowel medication which was likely source of his diarrhea.   - was started on po vanc. Will continue with plan for 10 days     Nausea/vomiting - resolved   - intermittent.   - constipation has resolved although still has some stool on kub. Having daily BMs   - concern possible gastroparesis with uncontrolled DM  - discussed about minimizing narcotics - tapering off - if further vomiting, will add Reglan     ???  H/o multiple MRSA wounds  - cont CHG baths, mupirocin to nares  ???  Uncontrolled DM2  - lantus 40, aspart 16  - [facility]  - diabetes RN consulted  - patient receives insulin through [facility] and has medications covered   ???  FEN: no IVF, replace lytes PRN, ADA diet  Ppx: lovenox  Code: Full  Dispo: continue inpatient care. Awaiting ortho recommendation.  Discussed with ortho clinic. Will discharge home after ensuring wound vac is no longer needed.    ________________________________________________________________________    Subjective  Bryan Bell is a 36 y.o. male. No acute events overnight. Patient stated that surgery team came and discontinued wound vac lastnight.       Medications  Scheduled Meds:    ascorbic acid (VITAMIN C) tablet 500 mg 500 mg Oral QDAY   buPROPion XL (WELLBUTRIN XL) tablet 150 mg 150 mg Oral QDAY   enoxaparin (LOVENOX) syringe 40 mg 40 mg Subcutaneous QDAY(21)   insulin aspart U-100 (NOVOLOG FLEXPEN) injection PEN 0-14 Units 0-14 Units Subcutaneous ACHS   insulin  aspart U-100 (NOVOLOG FLEXPEN) injection PEN 16 Units 16 Units Subcutaneous TID w/ meals   insulin glargine (LANTUS SOLOSTAR, BASAGLAR) injection PEN 40 Units 40 Units Subcutaneous QHS(22)   polyethylene glycol 3350 (MIRALAX) packet 17 g 1 packet Oral BID   senna/docusate (SENOKOT-S) tablet 2 tablet 2 tablet Oral BID   vancomycin [facility]) oral solution 125 mg 125 mg Oral QID   vitamins, multi w/minerals tablet 1 tablet 1 tablet Oral QDAY   zinc sulfate capsule 220 mg 220 mg Oral QDAY   Continuous Infusions:  PRN and Respiratory Meds:acetaminophen Q6H PRN, HYDROcodone/acetaminophen Q6H PRN, ondansetron (ZOFRAN) IV Q6H PRN, traZODone QHS PRN, vitamin A & D PRN       Objective:                          Vital Signs: Last Filed                 Vital Signs: 24 Hour Range   BP: 130/81 (11/28 2357)  Temp: 36.8 ???C (98.2 ???F) (11/28 2357)  Pulse: 89 (11/28 2357) Respirations: 16 PER MINUTE (11/28 2357)  SpO2: 100 % (11/28 2357)  O2 Delivery: None (Room Air) (11/28 2357) BP: (125-132)/(68-81)   Temp:  [36.3 ???C (97.4 ???F)-36.8 ???C (98.2 ???F)]   Pulse:  [89-98]   Respirations:  [14 PER MINUTE-16 PER MINUTE]   SpO2:  [97 %-100 %]   O2 Delivery: None (Room Air)   Intensity Pain Scale (Self Report): 5 (11/17/17 2200) Vitals:    11/02/17 1743 11/03/17 0000   Weight: 99.8 kg (220 lb) 90.5 kg (199 lb 9.6 oz)       Intake/Output Summary:  (Last 24 hours)    Intake/Output Summary (Last 24 hours) at 11/18/17 0824  Last data filed at 11/17/17 2358   Gross per 24 hour   Intake             1300 ml   Output              600 ml   Net              700 ml      Stool Occurrence: 1    Physical Exam  General:  Alert & cooperative, NAD  Head:  Normocephalic, atraumatic  Eyes:  Conjunctiva clear.  PERRL, EOMI.    Neck:  Supple, symmetrical, no adenopathy, no JVD  Lungs:  Clear to auscultation in all lung fields; no rhonchi, wheezing, or rales  Heart:   RRR, no murmur, rub, click, or gallop  Abdomen:  Soft, mild tenderness in epigastric area and left lower quadrant   Extremities: Right lower extremity with dressing in place, left bka.  I  Pulses:  2+ and symmetric, all extremities  Neuro:  No focal deficits.     Lab Review  24-hour labs:    Results for orders placed or performed during the hospital encounter of 11/02/17 (from the past 24 hour(s))   POC GLUCOSE    Collection Time: 11/17/17  9:13 AM   Result Value Ref Range    Glucose, POC 181 (H) 70 - 100 MG/DL   POC GLUCOSE    Collection Time: 11/17/17 10:54 AM   Result Value Ref Range    Glucose, POC 94 70 - 100 MG/DL   POC GLUCOSE    Collection Time: 11/17/17  5:20 PM   Result Value Ref Range    Glucose, POC 261 (H) 70 - 100 MG/DL  POC GLUCOSE    Collection Time: 11/17/17  9:59 PM   Result Value Ref Range    Glucose, POC 192 (H) 70 - 100 MG/DL   CBC    Collection Time: 11/18/17  4:37 AM   Result Value Ref Range White Blood Cells 7.8 4.5 - 11.0 K/UL    RBC 4.14 (L) 4.4 - 5.5 M/UL    Hemoglobin 11.1 (L) 13.5 - 16.5 GM/DL    Hematocrit 16.1 (L) 40 - 50 %    MCV 82.4 80 - 100 FL    MCH 26.9 26 - 34 PG    MCHC 32.6 32.0 - 36.0 G/DL    RDW 09.6 11 - 15 %    Platelet Count 372 150 - 400 K/UL    MPV 7.0 7 - 11 FL   COMPREHENSIVE METABOLIC PANEL    Collection Time: 11/18/17  4:37 AM   Result Value Ref Range    Sodium 136 (L) 137 - 147 MMOL/L    Potassium 4.5 3.5 - 5.1 MMOL/L    Chloride 102 98 - 110 MMOL/L    Glucose 293 (H) 70 - 100 MG/DL    Blood Urea Nitrogen 27 (H) 7 - 25 MG/DL    Creatinine 0.45 (H) 0.4 - 1.24 MG/DL    Calcium 9.3 8.5 - 40.9 MG/DL    Total Protein 7.4 6.0 - 8.0 G/DL    Total Bilirubin 0.2 (L) 0.3 - 1.2 MG/DL    Albumin 3.7 3.5 - 5.0 G/DL    Alk Phosphatase 91 25 - 110 U/L    AST (SGOT) 16 7 - 40 U/L    CO2 27 21 - 30 MMOL/L    ALT (SGPT) 19 7 - 56 U/L    Anion Gap 7 3 - 12    eGFR Non African American 52 (L) >60 mL/min    eGFR African American >60 >60 mL/min       Point of Care Testing  (Last 24 hours)  Glucose: (!) 293 (11/18/17 0437)  POC Glucose (Download): (!) 192 (11/17/17 2159)    Radiology and other Diagnostics Review:    Pertinent radiology reviewed.    Sandi Mariscal, MD   Pager [phone]

## 2017-11-18 NOTE — Progress Notes
Pt returned from outside, wound vacc off. Pt stated that the Dr's had come in and taken it off. Paged med D team to inform that wound vacc is not connected at this time.

## 2017-11-18 NOTE — Progress Notes
Pt refusing fall risk bundle. Goes outside to smoke.

## 2017-11-18 NOTE — Progress Notes
Late entry from 11/17/2017 at approximately 17:00    Entered Room to find pt had disconnected wound vac and was in wheelchair stating he was going to go smoke.  I informed pt that smoking was not good for his wound healing.  I also instructed pt that he was not to disconnect his wound vac.  I informed pt that failure to appropriately care for his wound could result in a complication leading to an amputation.  The pt states he understands this fact very well because that is why he underwent a left bka.  Removed wound vac dressing- wound demonstrates development of good granulation, but not sufficient enough now to proceed with Skin Grafting.  Pt instructed on fact that I was going to discontinue his wound vac and change to dressing changes.  Pt informed that he would need daily dressing changes with xerform, guaze, and soft roll and an ace wrap.  When I d/w pt going to a homeless shelter to get the best medical care we could for him, the pt refused and stated he did not want to go anywhere that would put restrictions on his behavior.  Pt stated he would rather live on the street as a homeless person fully knowing that these actions could result in him losing his right leg.  Pt may f/u with me in 2 wks for a wound check.

## 2017-11-18 NOTE — Case Management (ED)
Case Management Progress Note    NAME:Bryan Bell                          MRN: [id number]              DOB:09-Apr-1981          AGE: 36 y.o.  ADMISSION DATE: 11/02/2017             DAYS ADMITTED: LOS: 16 days      Todays Date: 11/18/2017    Plan  Pt to d/c when medically stable to streets vs friend hotel      Interventions  ? Support      ? Info or Referral      ? Discharge Planning    Wound vac has been removed and 4x4 gauze with tape and ace wrap has been applied.  These items are cheap and available at any local pharmacy.  Pt is uninsured so must pay OOP for supplies.  Pt finished all Po Abx tomorrow AM.  Pt has been refusing last few doses of Po ABx.  No therapy recommendations at this time.  No other CM needs identified at this time  NCm will continue to assess and monitor for changes       ? Medication Needs   Medication Needs: Other  ? Financial   Financial: [employer] Referral/Follow-up, Financial Counseling Referral/Follow-up  ? Legal      ? Other        Disposition  ? Expected Discharge Date    Expected Discharge Date: 11/19/17  ? Transportation   Does the patient need discharge transport arranged?: Yes  Transportation Name, Phone and Availability #1: bus pass  Does the patient use Medicaid Transportation?: No  ? Next Level of Care (Acute Psych discharges only)      ? Discharge Disposition                                          Durable Medical Equipment     No service has been selected for the patient.      [place] [facility]     No service has been selected for the patient.      [facility]     No service has been selected for the patient.      [facility]     No service has been selected for the patient.

## 2017-11-18 NOTE — Case Management (ED)
CMA Note:    Received [facility] from [facility] Alvira MondayEvan Mitchell to deliver 2 bus passes and clothing to pt. Bus passes and clothing delivered to pt.     Merryl Hackeriera Jessicaann Overbaugh  Case Management Assistant  For additional assistance please contact SWCM Alvira MondayEvan Mitchell  [phone]

## 2017-11-18 NOTE — Progress Notes
Pt allowed RN to examine wound, wound vacc dressing removed, xeroform gauze and 4 x 4 in place. Pt leaving unit in wheelchair.

## 2017-11-19 ENCOUNTER — Encounter: Admit: 2017-11-19 | Discharge: 2017-11-19 | Payer: MEDICAID

## 2017-11-19 LAB — POC GLUCOSE
Lab: 192 mg/dL — ABNORMAL HIGH (ref 70–100)
Lab: 113 mg/dL — ABNORMAL HIGH (ref 70–100)
Lab: 58 mg/dL — ABNORMAL LOW (ref 70–100)
Lab: 84 mg/dL (ref 70–100)

## 2017-11-19 MED ORDER — INSULIN GLARGINE 100 UNIT/ML (3 ML) SC INJ PEN
40 [IU] | Freq: Every evening | SUBCUTANEOUS | 3 refills | 60.00000 days | Status: AC
Start: 2017-11-19 — End: 2018-01-14

## 2017-11-19 MED ORDER — VANCOMYCIN 25 MG/ML PO SOLR
125 mg | Freq: Four times a day (QID) | ORAL | 0 refills | 10.00000 days | Status: AC
Start: 2017-11-19 — End: ?
  Filled 2017-11-19: qty 150, 8d supply

## 2017-11-19 MED ORDER — LISINOPRIL 10 MG PO TAB
10 mg | ORAL_TABLET | Freq: Every day | ORAL | 3 refills | Status: AC
Start: 2017-11-19 — End: 2018-01-17

## 2017-11-19 MED ORDER — HYDROCODONE-ACETAMINOPHEN 5-325 MG PO TAB
1 | Freq: Once | ORAL | 0 refills | Status: CP
Start: 2017-11-19 — End: ?
  Administered 2017-11-20: 01:00:00 1 via ORAL

## 2017-11-19 NOTE — Progress Notes
General Progress Note  Patient evaluated this am. Still offered different options of going to homeless shelter however he is adamant about not going to homeless shelters.   After discharge orders were completed, patient made SI comments to nursing staff.   psychiatry team was consulted.  Pr psych, low concern for SI as patient had stated to them he was only saying he will use street drugs to treat pain.  Ok to discharge form hospital from their standpoint.     He has follow up with orthopedic surgery. He plans to follow with [facility] for management  of his DM and other medical problems.     ATTESTATION    Total floor unit time 40 minutes performing discharge service.     Staff name:  Sandi MariscalPeniel Shayanna Thatch, MD Date:  11/19/2017

## 2017-11-19 NOTE — Progress Notes
Infectious Diseases Progress Note    11/19/2017    Admission Date: 11/02/2017  LOS: 17 days                     IMPRESSION:  1. Recurrent MRSA abscess. 11/02/17 with lateral right lower leg abscess, also growing F. magna   2. psychosocioeconomic circumstance  3. Diabetes mellitus type 1  4. H/o left BKA  5. Pruritis and body scratching  6. Anemia  7. Acute kidney injury  8. C. Diff +PCR 11/08/17  ???  ???  ???  ???  PLAN:  1. Cont mupirocin to nares, Use CHG bathing  2. Due to AKI, changed abx to Linezolid PO 600 mg BID. Completed therapy for 7d post-op, only soft tissue disease appreciated. Will continue to monitor off abx  3. Monitor for drug adverse reaction/toxicity  4. Cont PO vanc for 10d total. End date: 11/19/17  5. Refuses influenza vaccine.     ???  Discussed with Dr. Dennie Maizes.   ???  Tobi Bastos, MD  Infectious Diseases Faculty  Pager: [phone]    ________________________________________________    Interval history:  Bryan Bell is a 36 y.o. male    Afebrile. . Planned for possible discharge today. Awaiting further eval. No abd pain. Stool is normal for him.     ROS:  No fevers, no chills  No CP/SOB  No new rash    Labs:  WBC-7.8, nl  Plt-372, nl  Creat-1.52, nl    ABX:  Vanc, zosyn --> linezolid 11/08/17, completed  PO vanc 11/09/17    Medications  Scheduled Meds:    ascorbic acid (VITAMIN C) tablet 500 mg 500 mg Oral QDAY   buPROPion XL (WELLBUTRIN XL) tablet 150 mg 150 mg Oral QDAY   enoxaparin (LOVENOX) syringe 40 mg 40 mg Subcutaneous QDAY(21)   insulin aspart U-100 (NOVOLOG FLEXPEN) injection PEN 0-14 Units 0-14 Units Subcutaneous ACHS   insulin aspart U-100 (NOVOLOG FLEXPEN) injection PEN 16 Units 16 Units Subcutaneous TID w/ meals   insulin glargine (LANTUS SOLOSTAR, BASAGLAR) injection PEN 40 Units 40 Units Subcutaneous QHS(22)   polyethylene glycol 3350 (MIRALAX) packet 17 g 1 packet Oral BID   senna/docusate (SENOKOT-S) tablet 2 tablet 2 tablet Oral BID vancomycin [facility]) oral solution 125 mg 125 mg Oral QID   vitamins, multi w/minerals tablet 1 tablet 1 tablet Oral QDAY   zinc sulfate capsule 220 mg 220 mg Oral QDAY   Continuous Infusions:    PRN and Respiratory Meds:acetaminophen Q6H PRN, HYDROcodone/acetaminophen Q8H PRN, ondansetron (ZOFRAN) IV Q6H PRN, traZODone QHS PRN, vitamin A & D PRN      Objective                       Vital Signs: Last Filed                 Vital Signs: 24 Hour Range   BP: 115/69 (11/30 0815)  Temp: 36.8 ???C (98.2 ???F) (11/30 0815)  Pulse: 97 (11/30 0815)  Respirations: 18 PER MINUTE (11/30 0815)  SpO2: 100 % (11/30 0815)  O2 Delivery: None (Room Air) (11/30 0815) BP: (115-162)/(69-89)   Temp:  [36.3 ???C (97.3 ???F)-36.8 ???C (98.2 ???F)]   Pulse:  [89-101]   Respirations:  [16 PER MINUTE-18 PER MINUTE]   SpO2:  [100 %]   O2 Delivery: None (Room Air)   Intensity Pain Scale (Self Report): 9 (11/19/17 1019) Vitals:    11/02/17 1743 11/03/17 0000  Weight: 99.8 kg (220 lb) 90.5 kg (199 lb 9.6 oz)           Physical Exam  Gen: NAD, conversant  HEENT:  MMM, no subconjunctival hemorrhages  CV: RRR  Resp: CTAB  Abd, soft, non tender, +BS  Extremity: L BKA, RLE dressed with wound vac in place  Derm: no rash/lesions appreciated          Lab Review  Full labs reviewed.     Recent Labs      11/18/17   0437   WBC  7.8           Radiology and other Diagnostics Review: Radiology viewed and reports reviewed    11/04/17 MRI:  IMPRESSION  1. ???Soft tissue ulceration overlying the lateral head of the fibula   without evidence of osteomyelitis.    2. ???Skin thickening, edema and enhancement of the subcutaneous soft   tissues of the leg, nonspecific, but can be seen in setting of cellulitis.   No drainable soft tissue fluid collection is identified.    3. ???Mild edema and enhancement within the multiple proximal leg muscles,   suggestive of myositis, as described above. No drainable intramuscular   fluid collection.        Tobi Bastos, MD

## 2017-11-19 NOTE — Progress Notes
Wound Ostomy Note    NAME:Bryan Bell                                                                   MRN: [id number]                 DOB:[birth year]          AGE: 36 y.o.  ADMISSION DATE: 11/02/2017             DAYS ADMITTED: LOS: 17 days      Reason for Consult/Visit: wound not pressure    Assessment/Plan:    Active Problems:    Wound, open, back    Uncontrolled type 2 diabetes mellitus with hyperglycemia, with long-term current use of insulin (HCC)    Hx of BKA, left (HCC)    Homelessness    Sepsis due to methicillin resistant Staphylococcus aureus (MRSA) (HCC)    Medical non-compliance    Severe malnutrition (HCC)    Per chart and Doc Flowsheet, pt's VAC has been dc'd.  Dressing changes are appropriate for nursing staff to complete.    Wound Team will sign-off.    Bryan HavensStephanie Jamin Panther, RN, BSN, [employer] EnergyCMSRN, [employer]  Wound Ostomy Nursing Consult Service  Office: [phone]  Pager: [phone]  After Hours Wound/Ostomy Team Pager: [phone]

## 2017-11-19 NOTE — Telephone Encounter
Ortho appnt scheduled as requested via VM by MD for 12/02/17 10:40AM.

## 2017-11-19 NOTE — Progress Notes
Patient agitated states "when I discharge I am going to take a big shot of heroin and end it". Patient gets on phone and states "Im discharging today they aren't giving me my pain medication I am going to need to self medicate" Dr. Dennie MaizesZelalem notified. Psych consulted. Will continue to monitor.

## 2017-11-19 NOTE — Progress Notes
Pt off unit to smoke in wheelchair at this time.

## 2017-11-20 ENCOUNTER — Inpatient Hospital Stay: Admit: 2017-11-10 | Discharge: 2017-11-10 | Payer: MEDICAID

## 2017-11-20 ENCOUNTER — Emergency Department: Admit: 2017-11-02 | Discharge: 2017-11-02 | Payer: MEDICAID

## 2017-11-20 ENCOUNTER — Inpatient Hospital Stay: Admit: 2017-11-02 | Discharge: 2017-11-20 | Disposition: A | Discharge status: Home / Self Care

## 2017-11-20 ENCOUNTER — Inpatient Hospital Stay: Admit: 2017-11-04 | Discharge: 2017-11-04 | Payer: MEDICAID

## 2017-11-20 ENCOUNTER — Inpatient Hospital Stay: Admit: 2017-11-03 | Discharge: 2017-11-03 | Payer: MEDICAID

## 2017-11-20 ENCOUNTER — Inpatient Hospital Stay: Admit: 2017-11-05 | Discharge: 2017-11-05 | Payer: MEDICAID

## 2017-11-20 DIAGNOSIS — F1011 Alcohol abuse, in remission: ICD-10-CM

## 2017-11-20 DIAGNOSIS — F419 Anxiety disorder, unspecified: ICD-10-CM

## 2017-11-20 DIAGNOSIS — L89892 Pressure ulcer of other site, stage 2: ICD-10-CM

## 2017-11-20 DIAGNOSIS — Z9119 Patient's noncompliance with other medical treatment and regimen: ICD-10-CM

## 2017-11-20 DIAGNOSIS — M549 Dorsalgia, unspecified: ICD-10-CM

## 2017-11-20 DIAGNOSIS — F17210 Nicotine dependence, cigarettes, uncomplicated: ICD-10-CM

## 2017-11-20 DIAGNOSIS — E11649 Type 2 diabetes mellitus with hypoglycemia without coma: ICD-10-CM

## 2017-11-20 DIAGNOSIS — R45851 Suicidal ideations: ICD-10-CM

## 2017-11-20 DIAGNOSIS — F1120 Opioid dependence, uncomplicated: ICD-10-CM

## 2017-11-20 DIAGNOSIS — Z89512 Acquired absence of left leg below knee: ICD-10-CM

## 2017-11-20 DIAGNOSIS — E1143 Type 2 diabetes mellitus with diabetic autonomic (poly)neuropathy: ICD-10-CM

## 2017-11-20 DIAGNOSIS — I10 Essential (primary) hypertension: ICD-10-CM

## 2017-11-20 DIAGNOSIS — L299 Pruritus, unspecified: ICD-10-CM

## 2017-11-20 DIAGNOSIS — G8929 Other chronic pain: ICD-10-CM

## 2017-11-20 DIAGNOSIS — Z590 Homelessness: ICD-10-CM

## 2017-11-20 DIAGNOSIS — K5900 Constipation, unspecified: ICD-10-CM

## 2017-11-20 DIAGNOSIS — J449 Chronic obstructive pulmonary disease, unspecified: ICD-10-CM

## 2017-11-20 DIAGNOSIS — E785 Hyperlipidemia, unspecified: ICD-10-CM

## 2017-11-20 DIAGNOSIS — M609 Myositis, unspecified: ICD-10-CM

## 2017-11-20 DIAGNOSIS — L02415 Cutaneous abscess of right lower limb: ICD-10-CM

## 2017-11-20 DIAGNOSIS — D649 Anemia, unspecified: ICD-10-CM

## 2017-11-20 DIAGNOSIS — F331 Major depressive disorder, recurrent, moderate: ICD-10-CM

## 2017-11-20 DIAGNOSIS — R000 Tachycardia, unspecified: ICD-10-CM

## 2017-11-20 DIAGNOSIS — Z794 Long term (current) use of insulin: ICD-10-CM

## 2017-11-20 DIAGNOSIS — Z9114 Patient's other noncompliance with medication regimen: ICD-10-CM

## 2017-11-20 DIAGNOSIS — Z915 Personal history of self-harm: ICD-10-CM

## 2017-11-20 DIAGNOSIS — G4730 Sleep apnea, unspecified: ICD-10-CM

## 2017-11-20 DIAGNOSIS — E43 Unspecified severe protein-calorie malnutrition: ICD-10-CM

## 2017-11-20 DIAGNOSIS — N179 Acute kidney failure, unspecified: ICD-10-CM

## 2017-11-20 DIAGNOSIS — E1165 Type 2 diabetes mellitus with hyperglycemia: ICD-10-CM

## 2017-11-20 DIAGNOSIS — A4102 Sepsis due to Methicillin resistant Staphylococcus aureus: Principal | ICD-10-CM

## 2017-11-20 DIAGNOSIS — Z8614 Personal history of Methicillin resistant Staphylococcus aureus infection: ICD-10-CM

## 2017-11-20 DIAGNOSIS — K3184 Gastroparesis: ICD-10-CM

## 2017-11-20 DIAGNOSIS — T368X5A Adverse effect of other systemic antibiotics, initial encounter: ICD-10-CM

## 2017-11-20 DIAGNOSIS — A0472 Enterocolitis due to Clostridium difficile, not specified as recurrent: ICD-10-CM

## 2017-11-20 LAB — POC GLUCOSE: Lab: 235 mg/dL — ABNORMAL HIGH (ref 70–100)

## 2017-11-20 NOTE — Progress Notes
I have reviewed the notes, assessment, and/or procedures performed by Jennifer Ng, nursing student and concur with her/his documentation unless otherwise noted.

## 2017-11-20 NOTE — Consults
PSYCHIATRY CONSULT NOTE    Room/Bed: HC520/01  Admission Date:     11/02/2017                                                LOS: 17 days    Consult type: Opinion with orders   Reason for Consult:      Patient homeless and has been in hospital for treatment of wound. He has refused to go to homeless shelter. Not insured for other placement. ???Patient was discharged this am and now making comments of SI. Please evaluate      Assessment:  1. Major depressive disorder, recurrent, moderate  2. Opiate dependence  3. Alcohol abuse in remission  4. Consider OCD    Recommendations:      ??? Patient declines inpatient psychiatric admission, does not currently appear to be a danger to self or others, denies suicidal or homicidal ideation, intent, plan.  Is future oriented.  Does not meet criteria for involuntary hold.  ??? Continue current Wellbutrin on discharge, no interaction with linezolid  ??? Follow-up at [facility] clinic      Please feel free to contact us with any additional questions or concerns by paging the consult team between 8am and 5pm on weekdays and between 8am and 3pm on weekends at [phone]. Otherwise, page the psychiatry resident on call.  ______________________________________________________________________  Chief Concern:  I was just angry about them messing with my pain medications    History of Present Illness:   Bryan Bell is a 36 y.o. male with a past psychiatric history of depression admitted for sepsis secondary to MRSA cellulitis.  Patient reporting chronic ongoing decreased mood, increased sleep, feelings of guilt, anhedonia, thoughts of death without suicidality.  States he was frustrated about the decrease in the frequency of his opiate medication and stated he might as well be taking heroin on the street and die from that.  Currently stating he did not mean that as a plan of action and adamantly denies any suicidal intent or plan.  Patient reports occasional compulsive public masturbation behaviors which he finds distressing.  Prescribed bupropion for this in the past and found it helpful.  Denies symptoms of mania, psychosis.    Access to firearms?  Denies    Past Psychiatric History:  History of cutting, no prior suicide attempts.  Previously diagnosed with anxiety, depression, bipolar disorder.  Treated with Wellbutrin, Seroquel, trazodone.  Follows at [facility] clinic.    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 Marital status: Single     Spouse name: N/A   ??? Number of children: N/A   ??? Years of education: N/A     Social History Main Topics   ??? Smoking status: Current Every Day Smoker     Packs/day: 1.00     Years: 18.00     Types: Cigarettes   ??? Smokeless tobacco: Never Used   ??? Alcohol use No      Comment: > 10 years use   ??? Drug use: No  Comment: > 5 years no use   ??? Sexual activity: Not on file     Other Topics Concern   ??? Not on file     Social History Narrative    ** Merged History Encounter **            Legal  Had been in jail for robbery, reincarcerated for parole violation, released in 2013.      Past Medical/Surgical History:  Past Medical History:   Diagnosis Date   ??? COPD (chronic obstructive pulmonary disease) (HCC)    ??? 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 Hyperlipidemia    ??? Hypertension    ??? Psychiatric illness     bipolar, depression, anxiety,    :    Past Surgical History:   Procedure Laterality Date   ??? LEG DEBRIDEMENT Right 11/05/2017    DEBRIDEMENT WOUND DEEP 20 SQ CM OR LESS - LOWER EXTREMITY performed by Heddings, Revonda Standard, MD at [facility]   ??? BELOW KNEE AMPUTATION Left    :      Home Medications:  Prescriptions Prior to Admission Medication Sig Dispense Refill Last Dose   ??? atorvastatin (LIPITOR) 20 mg tablet Take 20 mg by mouth daily.   Past Week   ??? blood sugar diagnostic (GLUCOCARD EXPRESSION) test strip Use one strip as directed before meals and at bedtime. 300 strip 3    ??? Blood-Glucose Meter (GLUCOCARD EXPRESSION) kit Use as directed 1 kit 0    ??? buPROPion XL (WELLBUTRIN XL) 150 mg tablet Take 150 mg by mouth every morning. Do not crush or chew.   Past Week   ??? [DISCONTINUED] insulin glargine (LANTUS SOLOSTAR, BASAGLAR) 100 unit/mL (3 mL) injection PEN Inject 90 Units under the skin at bedtime daily.   Past Week   ??? insulin lispro(+) (HUMALOG U-100 INSULIN) 100 unit/mL injection Inject 10 units under the skin three times daily before meals. (Patient taking differently: Inject 30-40 Units under the skin three times daily before meals.) 10 vial 1 Past Week   ??? insulin pen needles (disposable) (BD UF NANO PEN NEEDLES) 32 gauge x 5/32 pen needle Use one each as directed as Needed. Use with insulin injections. 300 each 3    ??? lancets 30 guage 30 gauge Use 1 each as directed four times daily as needed. 300 each 11    ??? [DISCONTINUED] lisinopril (PRINIVIL; ZESTRIL) 10 mg tablet Take 10 mg by mouth daily.   Past Week   ??? ranitidine(+) (ZANTAC) 150 mg tablet Take 150 mg by mouth twice daily.   Past Week       Hospital Medications:  Scheduled Meds:  ascorbic acid (VITAMIN C) tablet 500 mg 500 mg Oral QDAY   buPROPion XL (WELLBUTRIN XL) tablet 150 mg 150 mg Oral QDAY   enoxaparin (LOVENOX) syringe 40 mg 40 mg Subcutaneous QDAY(21)   HYDROcodone/acetaminophen (NORCO) 5/325 mg tablet 1 tablet 1 tablet Oral ONCE   insulin aspart U-100 (NOVOLOG FLEXPEN) injection PEN 0-14 Units 0-14 Units Subcutaneous ACHS   insulin aspart U-100 (NOVOLOG FLEXPEN) injection PEN 16 Units 16 Units Subcutaneous TID w/ meals   insulin glargine (LANTUS SOLOSTAR, BASAGLAR) injection PEN 40 Units 40 Units Subcutaneous QHS(22) polyethylene glycol 3350 (MIRALAX) packet 17 g 1 packet Oral BID   senna/docusate (SENOKOT-S) tablet 2 tablet 2 tablet Oral BID   vancomycin [facility]) oral solution 125 mg 125 mg Oral QID   vitamins, multi w/minerals tablet 1  tablet 1 tablet Oral QDAY   zinc sulfate capsule 220 mg 220 mg Oral QDAY   Continuous Infusions:  PRN and Respiratory Meds:acetaminophen Q6H PRN, HYDROcodone/acetaminophen Q8H PRN, ondansetron (ZOFRAN) IV Q6H PRN, traZODone QHS PRN, vitamin A & D PRN  :    Allergies:  Patient has no known allergies.      Review of Systems:  A 14 point review of systems was negative except for: Constitutional: positive for fatigue  Musculoskeletal: positive for arthralgias  Behvioral/Psych: positive for depression    Vital Signs:  Last Filed in 24 hours Vital Signs:  24 hour Range    BP: 147/85 (11/30 1455)  Temp: 36.7 ???C (98.1 ???F) (11/30 1455)  Pulse: 92 (11/30 1455)  Respirations: 16 PER MINUTE (11/30 1455)  SpO2: 100 % (11/30 1455)  O2 Delivery: None (Room Air) (11/30 1455) BP: (115-162)/(69-88)   Temp:  [36.7 ???C (98.1 ???F)-36.8 ???C (98.2 ???F)]   Pulse:  [92-101]   Respirations:  [16 PER MINUTE-18 PER MINUTE]   SpO2:  [100 %]   O2 Delivery: None (Room Air)     Mental Status Evaluation:    Appearance: Patient dressed in casual attire, poor grooming, appears older than stated age  Behavior: Cooperative  Eye contact: Fair  PMA: No psychomotor agitation or retardation noted  Mood: So-so  Affect: Dysphoric, mood congruent  Speech: Spontaneous production, normal volume, normal rate, rhythm and prosody  Thought process: Linear, logical, goal directed  Thought content: Denies SI/HI / intent / plan. Denies AVH. No overt delusions noted.  Sensorium: Does not appear to be responding to internal stimuli.    Cognition: Awake alert and oriented to person, time, place, situation  Attention and concentration: grossly intact  Memory: recent and remote grossly intact Language and vocabulary: appropriate for age / educational level  Insight: Limited  Judgement: Impaired    Focused Physical Exam:  Neuro: 2-12 intact, non-focal exam  Musculoskeletal: moving all four extremities      Lab/Radiology/Other Diagnostic Tests:  24-hour labs:    Results for orders placed or performed during the hospital encounter of 11/02/17 (from the past 24 hour(s))   POC GLUCOSE    Collection Time: 11/18/17  9:53 PM   Result Value Ref Range    Glucose, POC 192 (H) 70 - 100 MG/DL   POC GLUCOSE    Collection Time: 11/19/17  8:15 AM   Result Value Ref Range    Glucose, POC 113 (H) 70 - 100 MG/DL   POC GLUCOSE    Collection Time: 11/19/17  1:06 PM   Result Value Ref Range    Glucose, POC 58 (L) 70 - 100 MG/DL   POC GLUCOSE    Collection Time: 11/19/17  1:22 PM   Result Value Ref Range    Glucose, POC 84 70 - 100 MG/DL       Pertinent radiology reviewed      Vanita Panda, MD

## 2017-11-20 NOTE — Progress Notes
Bryan Bell discharged on 11/19/2017.   Marland Kitchen.  Discharge instructions reviewed with patient. No further questions at this time.   Valuables returned:   Personal Items / Valuables: Psychologist, sport and exerciseCell Phone, Assistive Devices, Pension scheme managerClothing  Assistive Devices Type: Wheelchair.  Home medications:    .    Functional assessment at discharge complete: Yes .    IV removed, patient's belongings packed. Patient eating dinner before d/c.

## 2017-11-23 ENCOUNTER — Encounter: Admit: 2017-11-23 | Discharge: 2017-11-23 | Payer: MEDICAID

## 2017-11-23 DIAGNOSIS — L0291 Cutaneous abscess, unspecified: Secondary | ICD-10-CM

## 2017-11-23 DIAGNOSIS — J449 Chronic obstructive pulmonary disease, unspecified: ICD-10-CM

## 2017-11-23 DIAGNOSIS — I10 Essential (primary) hypertension: ICD-10-CM

## 2017-11-23 DIAGNOSIS — E119 Type 2 diabetes mellitus without complications: Principal | ICD-10-CM

## 2017-11-23 DIAGNOSIS — E785 Hyperlipidemia, unspecified: ICD-10-CM

## 2017-11-23 DIAGNOSIS — F99 Mental disorder, not otherwise specified: ICD-10-CM

## 2017-11-23 MED ORDER — LACTATED RINGERS IV SOLP
2700 mL | Freq: Once | INTRAVENOUS | 0 refills | Status: CP
Start: 2017-11-23 — End: ?
  Administered 2017-11-24: 06:00:00 2700 mL via INTRAVENOUS

## 2017-11-24 ENCOUNTER — Emergency Department: Admit: 2017-11-24 | Discharge: 2017-11-24 | Payer: MEDICAID

## 2017-11-24 ENCOUNTER — Inpatient Hospital Stay: Admit: 2017-11-24 | Discharge: 2017-11-24 | Discharge status: Against Medical Advice

## 2017-11-24 DIAGNOSIS — Z89512 Acquired absence of left leg below knee: ICD-10-CM

## 2017-11-24 DIAGNOSIS — F17200 Nicotine dependence, unspecified, uncomplicated: ICD-10-CM

## 2017-11-24 DIAGNOSIS — B9562 Methicillin resistant Staphylococcus aureus infection as the cause of diseases classified elsewhere: ICD-10-CM

## 2017-11-24 DIAGNOSIS — J449 Chronic obstructive pulmonary disease, unspecified: ICD-10-CM

## 2017-11-24 DIAGNOSIS — L02818 Cutaneous abscess of other sites: Principal | ICD-10-CM

## 2017-11-24 DIAGNOSIS — E119 Type 2 diabetes mellitus without complications: ICD-10-CM

## 2017-11-24 DIAGNOSIS — E785 Hyperlipidemia, unspecified: ICD-10-CM

## 2017-11-24 DIAGNOSIS — I10 Essential (primary) hypertension: ICD-10-CM

## 2017-11-24 DIAGNOSIS — Z590 Homelessness: ICD-10-CM

## 2017-11-24 DIAGNOSIS — Z794 Long term (current) use of insulin: ICD-10-CM

## 2017-11-24 LAB — LACTIC ACID (BG - RAPID LACTATE): Lab: 2.2 MMOL/L — ABNORMAL HIGH (ref 0.5–2.0)

## 2017-11-24 LAB — COMPREHENSIVE METABOLIC PANEL
Lab: 0.3 mg/dL (ref 0.3–1.2)
Lab: 13 U/L (ref 7–40)
Lab: 137 MMOL/L — ABNORMAL LOW (ref 137–147)
Lab: 17 U/L (ref 7–56)
Lab: 23 MMOL/L (ref 21–30)
Lab: 3.5 g/dL (ref 3.5–5.0)
Lab: 6.9 g/dL (ref 6.0–8.0)
Lab: 97 U/L (ref 25–110)

## 2017-11-24 LAB — POC LACTATE: Lab: 1.7 MMOL/L (ref 0.5–2.0)

## 2017-11-24 LAB — BETA HYDROXYBUTYRATE (KETONES): Lab: 0.1 MMOL/L — ABNORMAL HIGH (ref <0.3)

## 2017-11-24 LAB — CBC AND DIFF: Lab: 9.8 10*3/uL (ref 4.5–11.0)

## 2017-11-24 MED ORDER — ENOXAPARIN 40 MG/0.4 ML SC SYRG
40 mg | Freq: Every day | SUBCUTANEOUS | 0 refills | Status: DC
Start: 2017-11-24 — End: 2017-11-24

## 2017-11-24 MED ORDER — NICOTINE 21 MG/24 HR TD PT24
1 | Freq: Every day | TRANSDERMAL | 0 refills | Status: DC
Start: 2017-11-24 — End: 2017-11-24

## 2017-11-24 MED ORDER — INSULIN ASPART 100 UNIT/ML SC FLEXPEN
0-14 [IU] | Freq: Before meals | SUBCUTANEOUS | 0 refills | Status: DC
Start: 2017-11-24 — End: 2017-11-24

## 2017-11-24 MED ORDER — INSULIN GLARGINE 100 UNIT/ML (3 ML) SC INJ PEN
40 [IU] | Freq: Every evening | SUBCUTANEOUS | 0 refills | Status: DC
Start: 2017-11-24 — End: 2017-11-24

## 2017-11-24 MED ORDER — SODIUM CHLORIDE 0.9 % IV SOLP
INTRAVENOUS | 0 refills | Status: DC
Start: 2017-11-24 — End: 2017-11-24
  Administered 2017-11-24: 16:00:00 1000.000 mL via INTRAVENOUS

## 2017-11-24 MED ORDER — LINEZOLID IN DEXTROSE 5% 600 MG/300 ML IV PGBK
600 mg | Freq: Two times a day (BID) | INTRAVENOUS | 0 refills | Status: DC
Start: 2017-11-24 — End: 2017-11-24
  Administered 2017-11-24: 16:00:00 600 mg via INTRAVENOUS

## 2017-11-24 MED ORDER — KETOROLAC 15 MG/ML IJ SOLN
15 mg | Freq: Once | INTRAVENOUS | 0 refills | Status: DC
Start: 2017-11-24 — End: 2017-11-24

## 2017-11-24 MED ORDER — VANCOMYCIN 1,500 MG IVPB
1500 mg | Freq: Once | INTRAVENOUS | 0 refills | Status: CP
Start: 2017-11-24 — End: ?
  Administered 2017-11-24 (×2): 1500 mg via INTRAVENOUS

## 2017-11-24 MED ORDER — BUPROPION XL 150 MG PO TB24
150 mg | Freq: Every morning | ORAL | 0 refills | Status: DC
Start: 2017-11-24 — End: 2017-11-24

## 2017-11-24 MED ORDER — MAGNESIUM SULFATE IN D5W 1 GRAM/100 ML IV PGBK
1 g | INTRAVENOUS | 0 refills | Status: DC | PRN
Start: 2017-11-24 — End: 2017-11-24

## 2017-11-24 MED ORDER — INSULIN ASPART 100 UNIT/ML SC FLEXPEN
16 [IU] | Freq: Three times a day (TID) | SUBCUTANEOUS | 0 refills | Status: DC
Start: 2017-11-24 — End: 2017-11-24

## 2017-11-24 MED ORDER — ATORVASTATIN 10 MG PO TAB
20 mg | Freq: Every day | ORAL | 0 refills | Status: DC
Start: 2017-11-24 — End: 2017-11-24

## 2017-11-24 MED ORDER — LIDOCAINE-EPINEPHRINE 1 %-1:100,000 IJ SOLN
20 mL | Freq: Once | INTRAMUSCULAR | 0 refills | Status: CP
Start: 2017-11-24 — End: ?
  Administered 2017-11-24: 09:00:00 20 mL via INTRAMUSCULAR

## 2017-11-24 MED ORDER — ACETAMINOPHEN 500 MG PO TAB
1000 mg | Freq: Once | ORAL | 0 refills | Status: CP
Start: 2017-11-24 — End: ?
  Administered 2017-11-24: 09:00:00 1000 mg via ORAL

## 2017-11-24 MED ORDER — LISINOPRIL 10 MG PO TAB
10 mg | Freq: Every day | ORAL | 0 refills | Status: DC
Start: 2017-11-24 — End: 2017-11-24

## 2017-11-24 MED ORDER — ACETAMINOPHEN 500 MG PO TAB
1000 mg | Freq: Three times a day (TID) | ORAL | 0 refills | Status: DC
Start: 2017-11-24 — End: 2017-11-24

## 2017-11-24 MED ORDER — POTASSIUM CHLORIDE 20 MEQ PO TBTQ
40-60 meq | ORAL | 0 refills | Status: DC | PRN
Start: 2017-11-24 — End: 2017-11-24

## 2017-11-24 MED ORDER — POTASSIUM CHLORIDE 20 MEQ/15 ML PO LIQD
40-60 meq | NASOGASTRIC | 0 refills | Status: DC | PRN
Start: 2017-11-24 — End: 2017-11-24

## 2017-11-24 NOTE — ED Notes
Pt presents with swelling in R foot, no apparent swelling noted by RN. Pt was recently D/C for MRSA infection in lateral R foot. Pt is homeless and unable to afford any medications including his insulin. 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 Pt is A/Ox4.

## 2017-11-24 NOTE — ED Notes
Pt sleeping comfortably in cart at this time.

## 2017-11-24 NOTE — H&P (View-Only)
Admission History and Physical Examination      Name:  Bryan Bell                                             MRN:  [id number]   Admission Date:  11/23/2017                     Assessment/Plan:    Active Problems:    Abscess      Recurrent Skin Infection / Recent RLE Abscess 2/2 MRSA and Enterococcus:  - Recently discharged after management of RLE abscess. S/p I&D.  - Cx data grew MRSA, Enterococcus Faecalis, Strep Anginosus, Finegoldia Magna, and anaerobic GPR.  - No leukocytosis. Tachycardic.  - Finished Linezolid inpatient recently. Had AKI with Vancomycin.  - Given one dose of IV Vanc in the ED. S/p needle aspiration of LUE lesion with scant amount of return. Nothing sent for cultures. BCx obtained in the ED.   > Will place on IV Linezolid (till r/o bacteremia) due to his h/o AKI with Vanc that has just resolved.   > ID consulted again.   > Would team consulted.    DM II:  - Patient is on Insulin Lantus and Aspart.  - Historically, he followed with [facility] for his DM.   > Will place on Lantus 40 units and Aspart 16 units with meals along with [facility].   > Will need supplies at the time of discharge.    H/o Polysubstance Abuse:  - Has a h/o tobacco abuse. H/o Meth and heroin abuse.  - Denies current drug use to ED physician.  - Patient is fixating on getting narcotics and is abusive to staff a he was denied those.   > Will have nicotine patches available.   > Tylenol scheduled for pain. IV Toradol ordered in the ED but patient refused.    HTN/HLD:   > PTA Lisinopril and Lipitor    FEN:  - NS.  - Replace lytes prn.  - Regular Diet    PPx:  - SCDs. Lovenox.    Code:  - Full code.    Dispo:  - Admit to [facility].      Bryan Novas, MD  Clinical Assistant Professor - Internal Medicine Dept  [phone]  __________________________________________________________________________________  Primary Care Physician: Bryan Bell  Verified Chief Complaint:  Recurrent skin infection    History of Present Illness: Bryan Bell is a 36 y.o. male who presented to the ED with right leg swelling and pain. At the time of my evaluation, the patient has been angry as he was denied access to narcotics. He was belligerent to staff and to me and refused to answer any of my questions. He stated he is here with leg swelling and pain and we are not doing a f-ing thing about it. He also said he is refusing to answer my questions because he already told 3 other doctors everything and it is obvious that we have no communication. When I explained I have to verify the history myself and do my own assessment, the patient went quickly from prone position to seated position, swinging his arm at me and told me to shut up and to get the F out of the room.  Per chart review, the patient has a h/o MRSA/Enterococcus skin infections recently was that treated with Zyvox.  He is homeless and has not had access to any of his meds. He woke up today with RLE swelling and pain and came to the ED seeking evaluation as this is how he found out about his last infection. The patient has not taken any of his meds for few days now. No fevers, chills.    Review of Systems:  Review of systems not obtained from patient due to patient factors (Patient aggressiveness and refusal to participate in evaluation).      Past Medical History:   Diagnosis Date   ??? COPD (chronic obstructive pulmonary disease) (HCC)    ??? 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 Hyperlipidemia    ??? Hypertension    ??? Psychiatric illness     bipolar, depression, anxiety,      Past Surgical History:   Procedure Laterality Date   ??? LEG DEBRIDEMENT Right 11/05/2017    DEBRIDEMENT WOUND DEEP 20 SQ CM OR LESS - LOWER EXTREMITY performed by Heddings, Revonda Standard, MD at [facility]   ??? BELOW KNEE AMPUTATION Left      Family History   Problem Relation Age of Onset   ??? Coronary Artery Disease Mother         76 ??? Coronary Artery Disease Father    ??? Hypertension Other    ??? Diabetes Other      Social History     Social History   ??? Marital status: Single     Spouse name: N/A   ??? Number of children: N/A   ??? Years of education: N/A     Social History Main Topics   ??? Smoking status: Current Every Day Smoker     Packs/day: 1.00     Years: 18.00     Types: Cigarettes   ??? Smokeless tobacco: Never Used   ??? Alcohol use No      Comment: > 10 years use   ??? Drug use: No      Comment: > 5 years no use   ??? Sexual activity: Not on file     Other Topics Concern   ??? Not on file     Social History Narrative    ** Merged History Encounter **           Immunizations (includes history and patient reported):   Immunization History   Administered Date(s) Administered   ??? Tdap Vaccine 10/04/2017           Allergies:  Patient has no known allergies.    Medications:  Current Facility-Administered Medications   Medication   ??? ketorolac (TORADOL) injection 15 mg   ??? vancomycin (VANCOCIN) 1,500 mg in dextrose 5% (D5W) IVPB     Current Outpatient Prescriptions   Medication Sig   ??? atorvastatin (LIPITOR) 20 mg tablet Take 20 mg by mouth daily.   ??? blood sugar diagnostic (GLUCOCARD EXPRESSION) test strip Use one strip as directed before meals and at bedtime.   ??? Blood-Glucose Meter (GLUCOCARD EXPRESSION) kit Use as directed   ??? buPROPion XL (WELLBUTRIN XL) 150 mg tablet Take 150 mg by mouth every morning. Do not crush or chew.   ??? insulin glargine (LANTUS SOLOSTAR, BASAGLAR) 100 unit/mL (3 mL) injection PEN Inject forty Units under the skin at bedtime daily.   ??? insulin lispro(+) (HUMALOG U-100 INSULIN) 100 unit/mL injection Inject 10 units under the skin three times daily before meals. (Patient taking differently: Inject 30-40 Units under the  skin three times daily before meals.)   ??? insulin pen needles (disposable) (BD UF NANO PEN NEEDLES) 32 gauge x 5/32 pen needle Use one each as directed as Needed. Use with insulin injections. ??? lancets 30 guage 30 gauge Use 1 each as directed four times daily as needed.   ??? lisinopril (PRINIVIL; ZESTRIL) 10 mg tablet Take one tablet by mouth daily. Hold until follow up with your PCP   ??? ranitidine(+) (ZANTAC) 150 mg tablet Take 150 mg by mouth twice daily.       Physical Exam:  Vital Signs: Last Filed In 24 Hours Vital Signs: 24 Hour Range   BP: 150/88 (12/05 0130)  Temp: 36.6 ???C (97.9 ???F) (12/04 2108)  Pulse: 121 (12/05 0309)  Respirations: 20 PER MINUTE (12/05 0309)  SpO2: 100 % (12/05 0309)  O2 Delivery: None (Room Air) (12/05 0059)  SpO2 Pulse: 120 (12/05 0309) BP: (141-162)/(82-101)   Temp:  [36.6 ???C (97.9 ???F)]   Pulse:  [96-121]   Respirations:  [15 PER MINUTE-24 PER MINUTE]   SpO2:  [96 %-100 %]   O2 Delivery: None (Room Air)   Intensity Pain Scale (Self Report): 10 (11/24/17 0257)      EXAM WAS LIMITED DUE TO THE PATIENT'S IN-COOPERATION AND REFUSAL TO PARTICIPATE IN CARE  General:  Resting at time of me entering the room. Vulgar language, aggressive.  Head:  Normocephalic, without obvious abnormality, atraumatic  Extremities:  no cyanosis or edema. RLE with a lateral wound. LUE with a raised lesion with induration. RLE BKA  Pulses:   2+ and symmetric, all extremities  Skin:   No rashes or lesions  Neurologic:  Moves all extremities.      Lab/Radiology/Other Diagnostic Tests:  24-hour labs:    Results for orders placed or performed during the hospital encounter of 11/23/17 (from the past 24 hour(s))   CBC AND DIFF    Collection Time: 11/23/17 11:44 PM   Result Value Ref Range    White Blood Cells 9.8 4.5 - 11.0 K/UL    RBC 3.75 (L) 4.4 - 5.5 M/UL    Hemoglobin 10.1 (L) 13.5 - 16.5 GM/DL    Hematocrit 11.9 (L) 40 - 50 %    MCV 81.1 80 - 100 FL    MCH 27.1 26 - 34 PG    MCHC 33.4 32.0 - 36.0 G/DL    RDW 14.7 11 - 15 %    Platelet Count 342 150 - 400 K/UL    MPV 7.2 7 - 11 FL    Neutrophils 67 41 - 77 %    Lymphocytes 25 24 - 44 %    Monocytes 5 4 - 12 %    Eosinophils 2 0 - 5 % Basophils 1 0 - 2 %    Absolute Neutrophil Count 6.60 1.8 - 7.0 K/UL    Absolute Lymph Count 2.40 1.0 - 4.8 K/UL    Absolute Monocyte Count 0.50 0 - 0.80 K/UL    Absolute Eosinophil Count 0.20 0 - 0.45 K/UL    Absolute Basophil Count 0.10 0 - 0.20 K/UL   COMPREHENSIVE METABOLIC PANEL    Collection Time: 11/23/17 11:44 PM   Result Value Ref Range    Sodium 137 137 - 147 MMOL/L    Potassium 3.8 3.5 - 5.1 MMOL/L    Chloride 106 98 - 110 MMOL/L    Glucose 305 (H) 70 - 100 MG/DL    Blood Urea Nitrogen 21 7 - 25 MG/DL  Creatinine 1.01 0.4 - 1.24 MG/DL    Calcium 8.9 8.5 - 45.4 MG/DL    Total Protein 6.9 6.0 - 8.0 G/DL    Total Bilirubin 0.3 0.3 - 1.2 MG/DL    Albumin 3.5 3.5 - 5.0 G/DL    Alk Phosphatase 97 25 - 110 U/L    AST (SGOT) 13 7 - 40 U/L    CO2 23 21 - 30 MMOL/L    ALT (SGPT) 17 7 - 56 U/L    Anion Gap 8 3 - 12    eGFR Non African American >60 >60 mL/min    eGFR African American >60 >60 mL/min   LACTIC ACID (BG - RAPID LACTATE)    Collection Time: 11/23/17 11:44 PM   Result Value Ref Range    Lactic Acid,BG 2.2 (H) 0.5 - 2.0 MMOL/L   BETA HYDROXYBUTYRATE (KETONES)    Collection Time: 11/23/17 11:44 PM   Result Value Ref Range    Beta Hydroxybutyrate 0.1 <0.3 MMOL/L   POC LACTATE    Collection Time: 11/24/17  2:57 AM   Result Value Ref Range    LACTIC ACID POC 1.7 0.5 - 2.0 MMOL/L     FSBS (Manual): (!) 286 (11/24/17 0358)  Glucose: (!) 305 (11/23/17 2344)  Pertinent radiology reviewed.    Bryan Novas, MD  Pager [phone]

## 2017-11-24 NOTE — ED Notes
Pt continuing to refuse to be hooked up to vitals machine.

## 2017-11-24 NOTE — ED Notes
Attempted to give pt medications. Pt states "if its not insulin, I don't want it."  Pt very angry, when asked he verbalizes that he isn't getting the pain medication that he wanted. Attempted to explain that tylenol was the only thing order at this time. Pt grabs IV tubing and pulls out his IV. Verbalizes that he is leaving.

## 2017-11-24 NOTE — Progress Notes
Prior to me seeing patient this morning, patient demanded to leave.  Pt was escorted out of the ED and left AMA after not receiving the kind of pain medication that he wanted.  Pt was discharged with no changes to his medications given not being seen prior to leaving the ED.     Aris LotJeff Robbyn Hodkinson, DO  [facility]  [phone]

## 2017-11-24 NOTE — ED Notes
Pt IV beeping d/t occlusion on pt side. This RN promptly to bedside to silence alarm. Pt states, "if this doesn't stop making noise I'm going to destroy it, I don't care if I go to jail".

## 2017-11-24 NOTE — ED Notes
Report received from [place], Charity fundraiserN. Pt utilizing urinal at this time.

## 2017-11-24 NOTE — ED Notes
Pt belongings: wheelchair, umbrella, pants, shirt, one shoe, one sock - pt assumes responsibility for all other pt belongings

## 2017-11-24 NOTE — ED Notes
Per lab 2 sets of blood cultures collected by previous RN and in process.

## 2017-11-24 NOTE — ED Notes
Pt report given to Alexis RN

## 2017-11-29 ENCOUNTER — Encounter: Admit: 2017-11-29 | Discharge: 2017-11-29 | Payer: MEDICAID

## 2017-11-30 LAB — CULTURE-BLOOD W/SENSITIVITY

## 2017-12-01 NOTE — Discharge Instructions - Pharmacy
Physician Discharge Summary      Name: Bryan Bell  Medical Record Number: [id number]        Account Number:  [id number]  Date Of Birth:  27-Mar-1981                         Age:  36 years   Admit date:  11/23/2017                     Discharge date:  11/24/2017    Attending Physician:  Aris Lot, DO             Service: Med Private O- 2nd Round [phone]    Physician Summary completed by: Bethena Midget, DO    Reason for hospitalization: cellultitis    Significant PMH:   Past Medical History:   Diagnosis Date   ??? COPD (chronic obstructive pulmonary disease) (HCC)    ??? 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 Hyperlipidemia    ??? Hypertension    ??? Psychiatric illness     bipolar, depression, anxiety,        Allergies: Patient has no known allergies.    Admission Physical Exam notable for:   General:  Resting at time of me entering the room. Vulgar language, aggressive.  Head:  Normocephalic, without obvious abnormality, atraumatic  Extremities:  no cyanosis or edema. RLE with a lateral wound. LUE with a raised lesion with induration. RLE BKA  Pulses:   2+ and symmetric, all extremities  Skin:   No rashes or lesions  Neurologic:  Moves all extremities.      Admission Lab/Radiology studies notable for:   hgb 10.1  Lactate 2.2  Glucose 301    Brief Hospital Course:    Prior to me seeing patient this morning, patient demanded to leave.  Pt was escorted out of the ED and left AMA after not receiving the kind of pain medication that he wanted.  Pt was discharged with no changes to his medications given not being seen prior to leaving the ED.         Condition at Discharge: AMA    Discharge Diagnoses:      Hospital Problems        Active Problems    Abscess          Surgical Procedures: None    Significant Diagnostic Studies and Procedures: none    Consults:  None    Patient Disposition: Left against Medical Advice       Patient instructions/medications:      Activity as Tolerated It is important to keep increasing your activity level after you leave the hospital.  Moving around can help prevent blood clots, lung infection (pneumonia) and other problems.  Gradually increasing the number of times you are up moving around will help you return to your normal activity level more quickly.  Continue to increase the number of times you are up to the chair and walking daily to return to your normal activity level. Begin to work toward your normal activity level at discharge     Report These Signs and Symptoms    Please contact your doctor if you have any of the following symptoms: temperature higher than 100 degrees F or uncontrolled pain     Questions About Your Stay    For questions or concerns regarding your hospital stay. Call [phone]  Discharging attending physician: Dalbert Garnet,  L [[id number]]      Diabetic Diet    You should eat between 1600 and 2000 calories per day.  This is equal to 60g (grams) of carbohydrates per meal, and 30g of carbohydrates for a bedtime snack.    If you have questions about your diet after you go home, you can call a dietitian at [phone].      Current Discharge Medication List       CONTINUE these medications which have NOT CHANGED    Details   atorvastatin (LIPITOR) 20 mg tablet Take 20 mg by mouth daily.    PRESCRIPTION TYPE:  Historical Med      blood sugar diagnostic (GLUCOCARD EXPRESSION) test strip Use one strip as directed before meals and at bedtime.  Qty: 300 strip, Refills: 3    PRESCRIPTION TYPE:  Normal      Blood-Glucose Meter (GLUCOCARD EXPRESSION) kit Use as directed  Qty: 1 kit, Refills: 0    PRESCRIPTION TYPE:  Normal      buPROPion XL (WELLBUTRIN XL) 150 mg tablet Take 150 mg by mouth every morning. Do not crush or chew.    PRESCRIPTION TYPE:  Historical Med      insulin glargine (LANTUS SOLOSTAR, BASAGLAR) 100 unit/mL (3 mL) injection PEN Inject forty Units under the skin at bedtime daily.  Qty: 45 mL, Refills: 3 PRESCRIPTION TYPE:  No Print      insulin lispro(+) (HUMALOG U-100 INSULIN) 100 unit/mL injection Inject 10 units under the skin three times daily before meals.  Qty: 10 vial, Refills: 1    PRESCRIPTION TYPE:  Normal      insulin pen needles (disposable) (BD UF NANO PEN NEEDLES) 32 gauge x 5/32 pen needle Use one each as directed as Needed. Use with insulin injections.  Qty: 300 each, Refills: 3    PRESCRIPTION TYPE:  Normal      lancets 30 guage 30 gauge Use 1 each as directed four times daily as needed.  Qty: 300 each, Refills: 11    PRESCRIPTION TYPE:  Normal      lisinopril (PRINIVIL; ZESTRIL) 10 mg tablet Take one tablet by mouth daily. Hold until follow up with your PCP  Qty: 90 tablet, Refills: 3    PRESCRIPTION TYPE:  No Print  Comments: Hold until follow up with pcp      ranitidine(+) (ZANTAC) 150 mg tablet Take 150 mg by mouth twice daily.    PRESCRIPTION TYPE:  Historical Med              Scheduled appointments:    Dec 02, 2017 10:40 AM CST  Post - Op with Orlan Leavens, APRN  [facility]  [address]  [place] [address]  [phone]          Pending items needing follow up: none    Signed:  Bethena Midget, DO  12/01/2017      cc:  Primary Care Physician:  Lonell Face   PCP Unknown  Referring physicians:  No ref. provider found   Additional provider(s):

## 2017-12-02 ENCOUNTER — Encounter: Admit: 2017-12-02 | Discharge: 2017-12-02 | Payer: MEDICAID

## 2017-12-02 ENCOUNTER — Emergency Department: Admit: 2017-12-02 | Discharge: 2017-12-02 | Disposition: A | Discharge status: Home / Self Care

## 2017-12-02 DIAGNOSIS — F99 Mental disorder, not otherwise specified: ICD-10-CM

## 2017-12-02 DIAGNOSIS — E119 Type 2 diabetes mellitus without complications: Principal | ICD-10-CM

## 2017-12-02 DIAGNOSIS — E1165 Type 2 diabetes mellitus with hyperglycemia: Principal | ICD-10-CM

## 2017-12-02 DIAGNOSIS — J449 Chronic obstructive pulmonary disease, unspecified: ICD-10-CM

## 2017-12-02 DIAGNOSIS — E785 Hyperlipidemia, unspecified: ICD-10-CM

## 2017-12-02 DIAGNOSIS — F17210 Nicotine dependence, cigarettes, uncomplicated: ICD-10-CM

## 2017-12-02 DIAGNOSIS — Z794 Long term (current) use of insulin: ICD-10-CM

## 2017-12-02 DIAGNOSIS — I10 Essential (primary) hypertension: ICD-10-CM

## 2017-12-02 LAB — URINALYSIS DIPSTICK
Lab: NEGATIVE
Lab: NEGATIVE
Lab: NEGATIVE
Lab: NEGATIVE
Lab: NEGATIVE
Lab: NEGATIVE

## 2017-12-02 LAB — TSH WITH FREE T4 REFLEX: Lab: 1.1 uU/mL (ref 0.35–5.00)

## 2017-12-02 LAB — POC LACTATE: Lab: 3.4 MMOL/L — ABNORMAL HIGH (ref 0.5–2.0)

## 2017-12-02 LAB — COMPREHENSIVE METABOLIC PANEL
Lab: 0.7 mg/dL (ref 0.4–1.24)
Lab: 102 U/L (ref 25–110)
Lab: 11 10*3/uL (ref 3–12)
Lab: 12 U/L (ref 7–40)
Lab: 137 MMOL/L — ABNORMAL LOW (ref 137–147)
Lab: 23 MMOL/L (ref 21–30)
Lab: 3.2 MMOL/L — ABNORMAL LOW (ref 3.5–5.1)
Lab: 3.9 g/dL (ref 3.5–5.0)
Lab: 363 mg/dL — ABNORMAL HIGH (ref 70–100)
Lab: 7 U/L (ref 7–56)
Lab: 7.3 g/dL (ref 6.0–8.0)
Lab: 8 mg/dL (ref 7–25)
Lab: 9 mg/dL (ref 8.5–10.6)
Lab: >60 mL/min (ref >60)
Lab: >60 mL/min (ref >60)

## 2017-12-02 LAB — URINALYSIS, MICROSCOPIC

## 2017-12-02 LAB — BETA HYDROXYBUTYRATE (KETONES): Lab: 0.1 MMOL/L (ref <0.3)

## 2017-12-02 LAB — CBC AND DIFF
Lab: 0 10*3/uL (ref 0–0.20)
Lab: 3.9 M/UL — ABNORMAL LOW (ref 4.4–5.5)
Lab: 7.4 10*3/uL (ref 4.5–11.0)

## 2017-12-02 LAB — POC TROPONIN: Lab: 0 ng/mL (ref 0.00–0.05)

## 2017-12-02 LAB — OSMOLALITY: Lab: 298 mosm/kg (ref 280–307)

## 2017-12-02 LAB — MAGNESIUM: Lab: 1.4 mg/dL — ABNORMAL LOW (ref 1.6–2.6)

## 2017-12-02 LAB — BLOOD GASES, PERIPHERAL VENOUS: Lab: 7.3 (ref 7.30–7.40)

## 2017-12-02 LAB — POC GLUCOSE: Lab: 340 mg/dL — ABNORMAL HIGH (ref 70–100)

## 2017-12-02 MED ORDER — INSULIN GLARGINE 100 UNIT/ML (3 ML) SC INJ PEN
40 [IU] | Freq: Every evening | SUBCUTANEOUS | 3 refills | 60.00000 days | Status: AC
Start: 2017-12-02 — End: 2018-01-17

## 2017-12-02 MED ORDER — INSULIN LISPRO 100 UNIT/ML SC INPN
10 [IU] | Freq: Three times a day (TID) | SUBCUTANEOUS | 1 refills | 30.00000 days | Status: AC
Start: 2017-12-02 — End: 2018-01-14

## 2017-12-02 MED ORDER — MAGNESIUM SULFATE IN D5W 1 GRAM/100 ML IV PGBK
1 g | INTRAVENOUS | 0 refills | Status: CP
Start: 2017-12-02 — End: ?
  Administered 2017-12-02 (×2): 1 g via INTRAVENOUS

## 2017-12-02 MED ORDER — LACTATED RINGERS IV SOLP
1000 mL | INTRAVENOUS | 0 refills | Status: CP
Start: 2017-12-02 — End: ?
  Administered 2017-12-02: 18:00:00 1000 mL via INTRAVENOUS

## 2017-12-02 MED ORDER — LACTATED RINGERS IV SOLP
1000 mL | INTRAVENOUS | 0 refills | Status: CP
Start: 2017-12-02 — End: ?

## 2017-12-02 MED ORDER — POTASSIUM CHLORIDE 20 MEQ PO TBTQ
60 meq | Freq: Once | ORAL | 0 refills | Status: CP
Start: 2017-12-02 — End: ?
  Administered 2017-12-02: 19:00:00 60 meq via ORAL

## 2017-12-03 LAB — POC LACTATE: Lab: 1.5 MMOL/L (ref 0.5–2.0)

## 2017-12-06 LAB — CULTURE-FUNGAL,OTHER

## 2017-12-20 ENCOUNTER — Encounter: Admit: 2017-12-20 | Discharge: 2017-12-20 | Payer: MEDICAID

## 2017-12-20 DIAGNOSIS — F99 Mental disorder, not otherwise specified: ICD-10-CM

## 2017-12-20 DIAGNOSIS — E785 Hyperlipidemia, unspecified: ICD-10-CM

## 2017-12-20 DIAGNOSIS — E119 Type 2 diabetes mellitus without complications: Principal | ICD-10-CM

## 2017-12-20 DIAGNOSIS — I10 Essential (primary) hypertension: ICD-10-CM

## 2017-12-20 DIAGNOSIS — J449 Chronic obstructive pulmonary disease, unspecified: ICD-10-CM

## 2017-12-20 MED ORDER — POTASSIUM CHLORIDE IN WATER 10 MEQ/50 ML IV PGBK
10 meq | INTRAVENOUS | 0 refills | Status: CP
Start: 2017-12-20 — End: ?
  Administered 2017-12-21 (×4): 10 meq via INTRAVENOUS

## 2017-12-20 MED ORDER — SODIUM CHLORIDE 0.9 % IV SOLP
1000 mL | Freq: Once | INTRAVENOUS | 0 refills | Status: CP
Start: 2017-12-20 — End: ?
  Administered 2017-12-21: 05:00:00 1000 mL via INTRAVENOUS

## 2017-12-20 MED ORDER — INSULIN 100UNITS NS 100ML
1-32 [IU]/h | INTRAVENOUS | 0 refills | Status: DC
Start: 2017-12-20 — End: 2017-12-21
  Administered 2017-12-21 (×2): 9.53 [IU]/h via INTRAVENOUS

## 2017-12-21 ENCOUNTER — Emergency Department: Admit: 2017-12-21 | Discharge: 2017-12-21 | Discharge status: Against Medical Advice

## 2017-12-21 DIAGNOSIS — Z9119 Patient's noncompliance with other medical treatment and regimen: ICD-10-CM

## 2017-12-21 DIAGNOSIS — F17210 Nicotine dependence, cigarettes, uncomplicated: ICD-10-CM

## 2017-12-21 DIAGNOSIS — Z794 Long term (current) use of insulin: ICD-10-CM

## 2017-12-21 DIAGNOSIS — E1165 Type 2 diabetes mellitus with hyperglycemia: Principal | ICD-10-CM

## 2017-12-21 LAB — URINALYSIS DIPSTICK
Lab: NEGATIVE 10*3/uL (ref 3–12)
Lab: NEGATIVE U/L (ref 7–40)
Lab: NEGATIVE U/L (ref 7–56)
Lab: NEGATIVE mL/min (ref 1.0–4.8)

## 2017-12-21 LAB — CBC AND DIFF
Lab: 0 10*3/uL (ref 0–0.20)
Lab: 0.1 10*3/uL (ref 0–0.45)
Lab: 6.8 10*3/uL (ref 4.5–11.0)

## 2017-12-21 LAB — POC GLUCOSE
Lab: 588 mg/dL — ABNORMAL HIGH (ref 70–100)
Lab: 415 mg/dL — ABNORMAL HIGH (ref 70–100)
Lab: 422 mg/dL — ABNORMAL HIGH (ref 70–100)
Lab: 214 mg/dL — ABNORMAL HIGH (ref >60)
Lab: 156 mg/dL — ABNORMAL HIGH (ref 70–100)

## 2017-12-21 LAB — URINALYSIS, MICROSCOPIC

## 2017-12-21 LAB — PHOSPHORUS
Lab: 3 mg/dL — AB (ref 2.0–4.5)
Lab: 2.5 mg/dL (ref 2.0–4.5)
Lab: 2.9 mg/dL — ABNORMAL HIGH (ref 2.0–4.5)

## 2017-12-21 LAB — BLOOD GASES, PERIPHERAL VENOUS
Lab: 1.1 MMOL/L — AB (ref 0.3–1.2)
Lab: 25 MMOL/L (ref 25–110)
Lab: 7.3 /HPF (ref 7.30–7.40)
Lab: 86 % — ABNORMAL HIGH (ref 55–71)

## 2017-12-21 LAB — BASIC METABOLIC PANEL
Lab: 137 MMOL/L (ref 137–147)
Lab: 0.6 mg/dL (ref 0.4–1.24)
Lab: 140 MMOL/L (ref 137–147)
Lab: 187 mg/dL — ABNORMAL HIGH (ref 70–100)
Lab: 4 mg/dL — ABNORMAL HIGH (ref 3–12)
Lab: 4.2 MMOL/L (ref 3.5–5.1)
Lab: 7 mg/dL (ref 7–25)
Lab: 8.3 mg/dL — ABNORMAL LOW (ref >60)
Lab: >60 mL/min (ref >60)
Lab: >60 mL/min (ref >60)

## 2017-12-21 LAB — MAGNESIUM: Lab: 1.7 mg/dL — ABNORMAL HIGH (ref 1.6–2.6)

## 2017-12-21 LAB — POC CREATININE, RAD: Lab: 0.6 mg/dL (ref 0.4–1.24)

## 2017-12-21 LAB — COMPREHENSIVE METABOLIC PANEL
Lab: 135 MMOL/L — ABNORMAL LOW (ref 137–147)
Lab: 3.7 MMOL/L — ABNORMAL LOW (ref 3.5–5.1)
Lab: >60 mL/min (ref >60)

## 2017-12-21 LAB — BETA HYDROXYBUTYRATE (KETONES): Lab: 0.1 MMOL/L — ABNORMAL LOW (ref <0.3)

## 2017-12-21 NOTE — Other
Critical result or procedure called (document test and value, and read back):  Glucose 546  Time MD/NP Notified:  2338  MD/NP Name:  Dr. Willaim Bane  MD/NP Response/Orders Given:  acknowledged

## 2017-12-21 NOTE — ED Notes
Pt reports high blood sugar, generalized body aches, polyuria and polydipsia. Hx of DM, non-compliant w/ insulin. Pt last took insulin "I don't know, probably the last time I was in the hospital". 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 Denies SOB, CP, or recent fevers/chills. resp even and unlabored.

## 2017-12-21 NOTE — ED Notes
Pt heard screaming and using profanity.  Upon entering pt room, pt was found pulling down monitor on wall and screaming, "I didn't ask to be put on monitors, fuck you and fuck this!"  Pt given redirection and verbal education on behavior.  Pt continued to escalate, PD called for staff safety as pt started throwing all objects in the room at the walls.  Pt did not direct any objects towards staff.  Pt got dressed, PIV removed and escorted out by PD.

## 2017-12-21 NOTE — Other
Critical result or procedure called (document test and value, and read back): POC glucose 588  Time MD/NP Notified: 2145  MD/NP Name:  Dr. Maretta Bees  MD/NP Response/Orders Given: Acknowledged

## 2017-12-21 NOTE — ED Notes
Pt screaming in room, hitting call light multiple times.   This RN entered room to assist Pt/see what was wrong.   RN witnessed Pt ripping cords out of monitor, hitting buttons on the monitor, ripping vitals leads off of himself, ripping call light out of the wall. Pt yelling about "all the beeping." Pt states "I just want to come here, get my blood sugar taken care of, and not be hooked up to all of this bull shit!" RN explained to Pt that this behavior was not appropriate, and to please not handle equipment in that manner. Pt continued to be aggressive w/ staff and yell at this RN.     RN removed herself from the room due to Pt aggressive behavior.     PD called to bedside for Pt and staff safety.     PD entered room, Pt continued to yell/swear and behave aggressively towards PD.     Pt verbalizes that he was going to leave. RN went to remove Pt PIV. Pt became extremely aggressive and states "fuck that! I am going to rip it out!" RN advised Pt against this. PD intervened preventing Pt from ripping out IV/causing harm to himself. RN safely removed PIV.    Pt refused to sign AMA paper and yells profanities at this RN.     PD remain at bedside while Pt dressed and prepared to leave. Pt remains aggressive and agitated.  Pt yells "fuck this, I'm going to go drink!"    Pt ambulates out of ED via personal wheelchair. PD present upon Pt departure and escort him out of the ED.    Primary RN Princella Pellegrini. Updated on incident and Pt departure.

## 2018-01-12 ENCOUNTER — Encounter: Admit: 2018-01-12 | Discharge: 2018-01-12 | Payer: MEDICAID

## 2018-01-12 DIAGNOSIS — E785 Hyperlipidemia, unspecified: ICD-10-CM

## 2018-01-12 DIAGNOSIS — I10 Essential (primary) hypertension: ICD-10-CM

## 2018-01-12 DIAGNOSIS — E119 Type 2 diabetes mellitus without complications: Principal | ICD-10-CM

## 2018-01-12 DIAGNOSIS — J449 Chronic obstructive pulmonary disease, unspecified: ICD-10-CM

## 2018-01-12 DIAGNOSIS — F99 Mental disorder, not otherwise specified: ICD-10-CM

## 2018-01-12 MED ORDER — CLINDAMYCIN HCL 150 MG PO CAP
450 mg | Freq: Once | ORAL | 0 refills | Status: CP
Start: 2018-01-12 — End: ?
  Administered 2018-01-13: 06:00:00 450 mg via ORAL

## 2018-01-12 MED ORDER — INSULIN 100 UNITS IN 100 ML NS INJECTION
10 [IU] | Freq: Once | INTRAVENOUS | 0 refills | Status: DC
Start: 2018-01-12 — End: 2018-01-13

## 2018-01-12 MED ORDER — MUPIROCIN 2 % TP OINT
Freq: Once | TOPICAL | 0 refills | Status: CP
Start: 2018-01-12 — End: ?
  Administered 2018-01-13: 06:00:00 via TOPICAL

## 2018-01-12 MED ORDER — CEPHALEXIN 500 MG PO CAP
500 mg | Freq: Once | ORAL | 0 refills | Status: CP
Start: 2018-01-12 — End: ?
  Administered 2018-01-13: 06:00:00 500 mg via ORAL

## 2018-01-12 MED ORDER — POTASSIUM CHLORIDE 20 MEQ PO TBTQ
40 meq | Freq: Once | ORAL | 0 refills | Status: CP
Start: 2018-01-12 — End: ?
  Administered 2018-01-13: 05:00:00 40 meq via ORAL

## 2018-01-12 MED ORDER — INSULIN REGULAR HUMAN(#) 1 UNIT/ML IJ SYRINGE
10 [IU] | Freq: Once | INTRAVENOUS | 0 refills | Status: CP
Start: 2018-01-12 — End: ?
  Administered 2018-01-13: 05:00:00 10 [IU] via INTRAVENOUS

## 2018-01-12 MED ORDER — CLINDAMYCIN HCL 150 MG PO CAP
450 mg | ORAL_CAPSULE | ORAL | 0 refills | Status: AC
Start: 2018-01-12 — End: 2018-01-17

## 2018-01-12 MED ORDER — SODIUM CHLORIDE 0.9 % IV SOLP
2000 mL | Freq: Once | INTRAVENOUS | 0 refills | Status: CP
Start: 2018-01-12 — End: ?
  Administered 2018-01-13: 04:00:00 2000 mL via INTRAVENOUS

## 2018-01-13 ENCOUNTER — Observation Stay: Admit: 2018-01-13 | Discharge: 2018-01-17

## 2018-01-13 LAB — GRAM STAIN

## 2018-01-13 LAB — BETA HYDROXYBUTYRATE (KETONES): Lab: 0.1 MMOL/L — ABNORMAL LOW (ref <0.3)

## 2018-01-13 LAB — URINALYSIS DIPSTICK
Lab: 7 mg/dL — ABNORMAL HIGH (ref 5.0–8.0)
Lab: NEGATIVE K/UL — ABNORMAL HIGH (ref 3–12)
Lab: NEGATIVE U/L (ref 7–56)
Lab: NEGATIVE U/L — ABNORMAL HIGH (ref 25–110)
Lab: NEGATIVE g/dL (ref 3.5–5.0)
Lab: NEGATIVE mL/min (ref 1.0–4.8)

## 2018-01-13 LAB — POC GLUCOSE
Lab: 508 mg/dL — ABNORMAL HIGH (ref 70–100)
Lab: 427 mg/dL — ABNORMAL HIGH (ref 70–100)
Lab: 290 mg/dL — ABNORMAL HIGH (ref 70–100)
Lab: 277 mg/dL — ABNORMAL HIGH (ref 70–100)
Lab: 279 mg/dL — ABNORMAL HIGH (ref 70–100)
Lab: 328 mg/dL — ABNORMAL HIGH (ref 70–100)
Lab: 142 mg/dL — ABNORMAL HIGH (ref 70–100)
Lab: 224 mg/dL — ABNORMAL HIGH (ref 70–100)

## 2018-01-13 LAB — BLOOD GASES, PERIPHERAL VENOUS
Lab: 1.7 MMOL/L
Lab: 25 MMOL/L
Lab: 27 mmHg — ABNORMAL LOW (ref 33–48)
Lab: 47 mmHg (ref 36–50)
Lab: 53 % — ABNORMAL LOW (ref 55–71)
Lab: 7.3 (ref 7.30–7.40)

## 2018-01-13 LAB — URINALYSIS, MICROSCOPIC

## 2018-01-13 LAB — CBC AND DIFF
Lab: 0 10*3/uL (ref 0–0.20)
Lab: 0.1 10*3/uL (ref 0–0.45)
Lab: 11 10*3/uL (ref 4.5–11.0)

## 2018-01-13 LAB — COMPREHENSIVE METABOLIC PANEL
Lab: 135 MMOL/L — ABNORMAL LOW (ref 137–147)
Lab: >60 mL/min (ref >60)

## 2018-01-13 LAB — MAGNESIUM: Lab: 1.7 mg/dL — ABNORMAL LOW (ref 1.6–2.6)

## 2018-01-13 MED ORDER — LINEZOLID 600 MG PO TAB
600 mg | Freq: Two times a day (BID) | ORAL | 0 refills | Status: DC
Start: 2018-01-13 — End: 2018-01-13

## 2018-01-13 MED ORDER — MELATONIN 3 MG PO TAB
3 mg | Freq: Every evening | ORAL | 0 refills | Status: DC | PRN
Start: 2018-01-13 — End: 2018-01-17
  Administered 2018-01-14 – 2018-01-16 (×3): 3 mg via ORAL

## 2018-01-13 MED ORDER — INSULIN ASPART 100 UNIT/ML SC FLEXPEN
16 [IU] | Freq: Three times a day (TID) | SUBCUTANEOUS | 0 refills | Status: DC
Start: 2018-01-13 — End: 2018-01-14
  Administered 2018-01-13: 15:00:00 16 [IU] via SUBCUTANEOUS

## 2018-01-13 MED ORDER — INSULIN ASPART 100 UNIT/ML SC FLEXPEN
0-12 [IU] | Freq: Before meals | SUBCUTANEOUS | 0 refills | Status: DC
Start: 2018-01-13 — End: 2018-01-17
  Administered 2018-01-13: 15:00:00 8 [IU] via SUBCUTANEOUS

## 2018-01-13 MED ORDER — SODIUM CHLORIDE 0.9 % IV SOLP
INTRAVENOUS | 0 refills | Status: AC
Start: 2018-01-13 — End: ?
  Administered 2018-01-13 – 2018-01-15 (×3): 1000.000 mL via INTRAVENOUS

## 2018-01-13 MED ORDER — ENOXAPARIN 40 MG/0.4 ML SC SYRG
40 mg | Freq: Every day | SUBCUTANEOUS | 0 refills | Status: DC
Start: 2018-01-13 — End: 2018-01-17
  Administered 2018-01-14 – 2018-01-16 (×3): 40 mg via SUBCUTANEOUS

## 2018-01-13 MED ORDER — MUPIROCIN 2 % TP OINT
Freq: Two times a day (BID) | TOPICAL | 0 refills | Status: DC
Start: 2018-01-13 — End: 2018-01-15
  Administered 2018-01-13: 17:00:00 via TOPICAL

## 2018-01-13 MED ORDER — LISINOPRIL 10 MG PO TAB
10 mg | Freq: Every day | ORAL | 0 refills | Status: DC
Start: 2018-01-13 — End: 2018-01-17
  Administered 2018-01-13 – 2018-01-17 (×5): 10 mg via ORAL

## 2018-01-13 MED ORDER — ONDANSETRON HCL (PF) 4 MG/2 ML IJ SOLN
4 mg | INTRAVENOUS | 0 refills | Status: DC | PRN
Start: 2018-01-13 — End: 2018-01-17
  Administered 2018-01-14 – 2018-01-15 (×2): 4 mg via INTRAVENOUS

## 2018-01-13 MED ORDER — TRAMADOL 50 MG PO TAB
50-100 mg | ORAL | 0 refills | Status: DC | PRN
Start: 2018-01-13 — End: 2018-01-17
  Administered 2018-01-14: 07:00:00 100 mg via ORAL
  Administered 2018-01-14: 50 mg via ORAL
  Administered 2018-01-15 – 2018-01-16 (×6): 100 mg via ORAL

## 2018-01-13 MED ORDER — ATORVASTATIN 20 MG PO TAB
20 mg | Freq: Every day | ORAL | 0 refills | Status: DC
Start: 2018-01-13 — End: 2018-01-17
  Administered 2018-01-13 – 2018-01-17 (×5): 20 mg via ORAL

## 2018-01-13 MED ORDER — INSULIN ASPART 100 UNIT/ML SC FLEXPEN
0-6 [IU] | Freq: Before meals | SUBCUTANEOUS | 0 refills | Status: DC
Start: 2018-01-13 — End: 2018-01-13

## 2018-01-13 MED ORDER — INSULIN GLARGINE 100 UNIT/ML (3 ML) SC INJ PEN
40 [IU] | Freq: Every evening | SUBCUTANEOUS | 0 refills | Status: DC
Start: 2018-01-13 — End: 2018-01-14
  Administered 2018-01-14: 04:00:00 40 [IU] via SUBCUTANEOUS

## 2018-01-13 MED ORDER — ACETAMINOPHEN 500 MG PO TAB
500 mg | ORAL | 0 refills | Status: DC | PRN
Start: 2018-01-13 — End: 2018-01-17
  Administered 2018-01-13 – 2018-01-15 (×4): 500 mg via ORAL

## 2018-01-13 MED ORDER — CLOBETASOL 0.05 % TP CREA
Freq: Two times a day (BID) | TOPICAL | 0 refills | Status: DC
Start: 2018-01-13 — End: 2018-01-16
  Administered 2018-01-13: via TOPICAL

## 2018-01-14 ENCOUNTER — Encounter: Admit: 2018-01-14 | Discharge: 2018-01-14 | Payer: MEDICAID

## 2018-01-14 LAB — CBC AND DIFF
Lab: 0 % (ref 0–2)
Lab: 0 10*3/uL (ref 0–0.20)
Lab: 0.2 10*3/uL (ref 0–0.45)
Lab: 0.4 10*3/uL (ref 0–0.80)
Lab: 11 g/dL — ABNORMAL LOW (ref 13.5–16.5)
Lab: 15 % — ABNORMAL HIGH (ref 11–15)
Lab: 2 % (ref 0–5)
Lab: 2.6 10*3/uL (ref 1.0–4.8)
Lab: 27 pg (ref 26–34)
Lab: 28 % (ref 24–44)
Lab: 3.9 M/UL — ABNORMAL LOW (ref 4.4–5.5)
Lab: 32 % — ABNORMAL LOW (ref 40–50)
Lab: 33 g/dL (ref 32.0–36.0)
Lab: 5 % (ref 4–12)
Lab: 6 10*3/uL (ref 1.8–7.0)
Lab: 65 % (ref 41–77)
Lab: 8.2 FL (ref >60)
Lab: 83 FL — ABNORMAL HIGH (ref 80–100)
Lab: 9.2 10*3/uL (ref 4.5–11.0)

## 2018-01-14 LAB — BASIC METABOLIC PANEL: Lab: 134 MMOL/L — ABNORMAL LOW (ref 137–147)

## 2018-01-14 LAB — POC GLUCOSE
Lab: 261 mg/dL — ABNORMAL HIGH (ref 70–100)
Lab: 422 mg/dL — ABNORMAL HIGH (ref 70–100)
Lab: 256 mg/dL — ABNORMAL HIGH (ref 70–100)
Lab: 120 mg/dL — ABNORMAL HIGH (ref 70–100)
Lab: 221 mg/dL — ABNORMAL HIGH (ref 70–100)

## 2018-01-14 MED ORDER — BLOOD SUGAR DIAGNOSTIC MISC STRP
1 | ORAL_STRIP | Freq: Before meals | 3 refills | Status: CN
Start: 2018-01-14 — End: ?

## 2018-01-14 MED ORDER — INSULIN GLARGINE 100 UNIT/ML (3 ML) SC INJ PEN
40 [IU] | Freq: Two times a day (BID) | SUBCUTANEOUS | 0 refills | Status: DC
Start: 2018-01-14 — End: 2018-01-15

## 2018-01-14 MED ORDER — CLOBETASOL 0.05 % TP CREA
Freq: Two times a day (BID) | TOPICAL | 3 refills | Status: CN
Start: 2018-01-14 — End: ?

## 2018-01-14 MED ORDER — DOXYCYCLINE HYCLATE 100 MG PO TAB
100 mg | Freq: Two times a day (BID) | ORAL | 0 refills | Status: DC
Start: 2018-01-14 — End: 2018-01-15
  Administered 2018-01-15 (×2): 100 mg via ORAL

## 2018-01-14 MED ORDER — INSULIN ASPART 100 UNIT/ML SC FLEXPEN
25 [IU] | Freq: Three times a day (TID) | SUBCUTANEOUS | 0 refills | Status: DC
Start: 2018-01-14 — End: 2018-01-15

## 2018-01-15 LAB — POC GLUCOSE
Lab: 380 mg/dL — ABNORMAL HIGH (ref 70–100)
Lab: 230 mg/dL — ABNORMAL HIGH (ref 70–100)
Lab: 156 mg/dL — ABNORMAL HIGH (ref 70–100)
Lab: 161 mg/dL — ABNORMAL HIGH (ref 70–100)
Lab: 166 mg/dL — ABNORMAL HIGH (ref 70–100)
Lab: 54 mg/dL — ABNORMAL LOW (ref 70–100)
Lab: 78 mg/dL (ref 70–100)
Lab: 32 mg/dL — CL (ref 70–100)
Lab: 47 mg/dL — CL (ref 70–100)
Lab: 48 mg/dL — CL (ref 70–100)
Lab: 58 mg/dL — ABNORMAL LOW (ref 70–100)
Lab: 108 mg/dL — ABNORMAL HIGH (ref 70–100)
Lab: 135 mg/dL — ABNORMAL HIGH (ref 70–100)

## 2018-01-15 LAB — CULTURE-WOUND/TISSUE/FLUID(AEROBIC ONLY)W/SENSITIVITY

## 2018-01-15 MED ORDER — INSULIN ASPART 100 UNIT/ML SC FLEXPEN
15 [IU] | Freq: Three times a day (TID) | SUBCUTANEOUS | 0 refills | Status: DC
Start: 2018-01-15 — End: 2018-01-17

## 2018-01-15 MED ORDER — DOXYCYCLINE IVPB
100 mg | Freq: Two times a day (BID) | INTRAVENOUS | 0 refills | Status: DC
Start: 2018-01-15 — End: 2018-01-15

## 2018-01-15 MED ORDER — SODIUM CHLORIDE 0.9 % IV SOLP
INTRAVENOUS | 0 refills | Status: AC
Start: 2018-01-15 — End: ?
  Administered 2018-01-16 – 2018-01-17 (×4): 1000.000 mL via INTRAVENOUS

## 2018-01-15 MED ORDER — MUPIROCIN 2 % TP OINT
Freq: Two times a day (BID) | TOPICAL | 0 refills | Status: DC
Start: 2018-01-15 — End: 2018-01-17
  Administered 2018-01-15: 21:00:00 via TOPICAL

## 2018-01-15 MED ORDER — DOXYCYCLINE 100MG IVPB
100 mg | Freq: Two times a day (BID) | INTRAVENOUS | 0 refills | Status: DC
Start: 2018-01-15 — End: 2018-01-17
  Administered 2018-01-15 – 2018-01-17 (×8): 100 mg via INTRAVENOUS

## 2018-01-15 MED ORDER — DIPHENHYDRAMINE HCL 25 MG PO CAP
25 mg | ORAL | 0 refills | Status: DC | PRN
Start: 2018-01-15 — End: 2018-01-16
  Administered 2018-01-15: 21:00:00 25 mg via ORAL

## 2018-01-15 MED ORDER — DIPHENHYDRAMINE HCL 25 MG PO CAP
25-50 mg | ORAL | 0 refills | Status: DC | PRN
Start: 2018-01-15 — End: 2018-01-17
  Administered 2018-01-16: 02:00:00 25 mg via ORAL
  Administered 2018-01-16 (×2): 50 mg via ORAL

## 2018-01-15 MED ORDER — INSULIN GLARGINE 100 UNIT/ML (3 ML) SC INJ PEN
40 [IU] | Freq: Every day | SUBCUTANEOUS | 0 refills | Status: DC
Start: 2018-01-15 — End: 2018-01-17

## 2018-01-15 MED ORDER — INSULIN ASPART 100 UNIT/ML SC FLEXPEN
20 [IU] | Freq: Three times a day (TID) | SUBCUTANEOUS | 0 refills | Status: DC
Start: 2018-01-15 — End: 2018-01-15

## 2018-01-16 LAB — POC GLUCOSE
Lab: 237 mg/dL — ABNORMAL HIGH (ref 70–100)
Lab: 275 mg/dL — ABNORMAL HIGH (ref 70–100)
Lab: 179 mg/dL — ABNORMAL HIGH (ref 70–100)

## 2018-01-16 LAB — CBC AND DIFF: Lab: 9.1 K/UL — ABNORMAL LOW (ref 4.5–11.0)

## 2018-01-16 LAB — BASIC METABOLIC PANEL: Lab: 139 MMOL/L (ref 137–147)

## 2018-01-17 DIAGNOSIS — E1142 Type 2 diabetes mellitus with diabetic polyneuropathy: ICD-10-CM

## 2018-01-17 DIAGNOSIS — E1165 Type 2 diabetes mellitus with hyperglycemia: ICD-10-CM

## 2018-01-17 DIAGNOSIS — E11649 Type 2 diabetes mellitus with hypoglycemia without coma: ICD-10-CM

## 2018-01-17 DIAGNOSIS — A4902 Methicillin resistant Staphylococcus aureus infection, unspecified site: ICD-10-CM

## 2018-01-17 DIAGNOSIS — Z794 Long term (current) use of insulin: ICD-10-CM

## 2018-01-17 DIAGNOSIS — R739 Hyperglycemia, unspecified: Principal | ICD-10-CM

## 2018-01-17 DIAGNOSIS — L259 Unspecified contact dermatitis, unspecified cause: ICD-10-CM

## 2018-01-17 DIAGNOSIS — Z8614 Personal history of Methicillin resistant Staphylococcus aureus infection: ICD-10-CM

## 2018-01-17 DIAGNOSIS — Z9114 Patient's other noncompliance with medication regimen: ICD-10-CM

## 2018-01-17 DIAGNOSIS — T2122XA Burn of second degree of abdominal wall, initial encounter: ICD-10-CM

## 2018-01-17 LAB — POC GLUCOSE
Lab: 195 mg/dL — ABNORMAL HIGH (ref 70–100)
Lab: 201 mg/dL — ABNORMAL HIGH (ref 70–100)
Lab: 217 mg/dL — ABNORMAL HIGH (ref 70–100)

## 2018-01-17 LAB — BASIC METABOLIC PANEL
Lab: 104 MMOL/L — ABNORMAL LOW (ref >60)
Lab: 136 MMOL/L — ABNORMAL LOW (ref 137–147)
Lab: 4.6 MMOL/L — ABNORMAL LOW (ref 3.5–5.1)

## 2018-01-17 LAB — CBC AND DIFF: Lab: 8.2 10*3/uL — ABNORMAL LOW (ref >60)

## 2018-01-17 MED ORDER — MUPIROCIN 2 % TP OINT
Freq: Two times a day (BID) | TOPICAL | 0 refills | 11.00000 days | Status: AC
Start: 2018-01-17 — End: ?

## 2018-01-17 MED ORDER — DOXYCYCLINE HYCLATE 100 MG PO TAB
100 mg | ORAL_TABLET | Freq: Two times a day (BID) | ORAL | 0 refills | 8.00000 days | Status: AC
Start: 2018-01-17 — End: ?

## 2018-01-17 MED ORDER — LANCETS 30 GAUGE MISC MISC
1 | Freq: Four times a day (QID) | 11 refills | Status: AC | PRN
Start: 2018-01-17 — End: ?

## 2018-01-17 MED ORDER — ATORVASTATIN 20 MG PO TAB
20 mg | ORAL_TABLET | Freq: Every day | ORAL | 1 refills | Status: AC
Start: 2018-01-17 — End: ?

## 2018-01-17 MED ORDER — INSULIN GLARGINE 100 UNIT/ML (3 ML) SC INJ PEN
40 [IU] | Freq: Every evening | SUBCUTANEOUS | 3 refills | 68.00000 days | Status: AC
Start: 2018-01-17 — End: 2018-05-05

## 2018-01-17 MED ORDER — BLOOD-GLUCOSE METER MISC KIT
PACK | 0 refills | 50.00000 days | Status: AC
Start: 2018-01-17 — End: ?

## 2018-01-17 MED ORDER — PEN NEEDLE, DIABETIC 32 GAUGE X 5/32" MISC NDLE
1 | 3 refills | 90.00000 days | Status: AC | PRN
Start: 2018-01-17 — End: ?

## 2018-01-17 MED ORDER — INSULIN ASPART 100 UNIT/ML SC FLEXPEN
15 [IU] | Freq: Three times a day (TID) | SUBCUTANEOUS | 3 refills | 30.00000 days | Status: AC
Start: 2018-01-17 — End: 2018-05-05

## 2018-01-17 MED ORDER — LISINOPRIL 10 MG PO TAB
10 mg | ORAL_TABLET | Freq: Every day | ORAL | 1 refills | Status: AC
Start: 2018-01-17 — End: ?

## 2018-01-17 MED ORDER — BLOOD SUGAR DIAGNOSTIC MISC STRP
1 | ORAL_STRIP | Freq: Before meals | 3 refills | 30.00000 days | Status: AC
Start: 2018-01-17 — End: ?

## 2018-03-24 ENCOUNTER — Encounter: Admit: 2018-03-24 | Discharge: 2018-03-24 | Payer: MEDICAID

## 2018-03-24 DIAGNOSIS — J449 Chronic obstructive pulmonary disease, unspecified: ICD-10-CM

## 2018-03-24 DIAGNOSIS — E119 Type 2 diabetes mellitus without complications: Principal | ICD-10-CM

## 2018-03-24 DIAGNOSIS — F99 Mental disorder, not otherwise specified: ICD-10-CM

## 2018-03-24 DIAGNOSIS — I10 Essential (primary) hypertension: ICD-10-CM

## 2018-03-24 DIAGNOSIS — E785 Hyperlipidemia, unspecified: ICD-10-CM

## 2018-05-04 DIAGNOSIS — R739 Hyperglycemia, unspecified: ICD-10-CM

## 2018-05-04 MED ORDER — ONDANSETRON HCL (PF) 4 MG/2 ML IJ SOLN
8 mg | Freq: Once | INTRAVENOUS | 0 refills | Status: CP
Start: 2018-05-04 — End: ?
  Administered 2018-05-05: 06:00:00 8 mg via INTRAVENOUS

## 2018-05-04 MED ORDER — MORPHINE 4 MG/ML IV CRTG
4-8 mg | INTRAVENOUS | 0 refills | Status: CP | PRN
Start: 2018-05-04 — End: ?
  Administered 2018-05-05: 08:00:00 8 mg via INTRAVENOUS
  Administered 2018-05-05: 06:00:00 4 mg via INTRAVENOUS

## 2018-05-04 MED ORDER — SODIUM CHLORIDE 0.9 % IV SOLP
1000 mL | INTRAVENOUS | 0 refills | Status: CP
Start: 2018-05-04 — End: ?
  Administered 2018-05-05: 06:00:00 1000 mL via INTRAVENOUS

## 2018-05-05 ENCOUNTER — Emergency Department: Admit: 2018-05-05 | Discharge: 2018-05-05 | Payer: MEDICAID

## 2018-05-05 ENCOUNTER — Emergency Department: Admit: 2018-05-04 | Discharge: 2018-05-04 | Payer: MEDICAID

## 2018-05-05 ENCOUNTER — Encounter: Admit: 2018-05-05 | Discharge: 2018-05-05 | Payer: MEDICAID

## 2018-05-05 ENCOUNTER — Emergency Department: Admit: 2018-05-05 | Discharge: 2018-05-05 | Disposition: A | Discharge status: Home / Self Care | Payer: MEDICAID

## 2018-05-05 DIAGNOSIS — N50819 Testicular pain, unspecified: ICD-10-CM

## 2018-05-05 DIAGNOSIS — E1165 Type 2 diabetes mellitus with hyperglycemia: Principal | ICD-10-CM

## 2018-05-05 DIAGNOSIS — I10 Essential (primary) hypertension: ICD-10-CM

## 2018-05-05 DIAGNOSIS — Z794 Long term (current) use of insulin: ICD-10-CM

## 2018-05-05 DIAGNOSIS — R739 Hyperglycemia, unspecified: ICD-10-CM

## 2018-05-05 LAB — POC BLOOD GAS VEN
Lab: 0 MMOL/L
Lab: 24 MMOL/L
Lab: 38 mmHg (ref 36–50)
Lab: 49 mmHg — ABNORMAL HIGH (ref 33–48)
Lab: 7.4 — ABNORMAL HIGH (ref 7.30–7.40)
Lab: 85 % — ABNORMAL HIGH (ref 55–71)

## 2018-05-05 LAB — URINALYSIS, MICROSCOPIC

## 2018-05-05 LAB — COMPREHENSIVE METABOLIC PANEL
Lab: 0.2 mg/dL — ABNORMAL LOW (ref 0.3–1.2)
Lab: 0.8 mg/dL (ref 0.4–1.24)
Lab: 10 U/L (ref 7–56)
Lab: 103 U/L (ref 25–110)
Lab: 107 MMOL/L — ABNORMAL LOW (ref 98–110)
Lab: 12 mg/dL (ref 7–25)
Lab: 138 MMOL/L — ABNORMAL LOW (ref 137–147)
Lab: 15 U/L (ref 7–40)
Lab: 24 MMOL/L (ref 21–30)
Lab: 3.3 g/dL — ABNORMAL LOW (ref 3.5–5.0)
Lab: 385 mg/dL — ABNORMAL HIGH (ref 70–100)
Lab: 7 K/UL (ref 3–12)
Lab: 7.1 g/dL (ref 6.0–8.0)
Lab: 8.8 mg/dL — ABNORMAL HIGH (ref 8.5–10.6)
Lab: >60 mL/min (ref >60)
Lab: >60 mL/min (ref >60)

## 2018-05-05 LAB — CBC AND DIFF
Lab: 0 10*3/uL (ref 0–0.20)
Lab: 0.1 10*3/uL (ref 0–0.45)
Lab: 8.3 10*3/uL (ref 4.5–11.0)

## 2018-05-05 LAB — URINALYSIS DIPSTICK
Lab: NEGATIVE %{Hb}
Lab: NEGATIVE %{Hb} — ABNORMAL LOW
Lab: NEGATIVE 10*3/uL — ABNORMAL HIGH (ref 3.8–10.8)
Lab: NEGATIVE mg/dL
Lab: NEGATIVE mg/dL — ABNORMAL HIGH (ref 3.80–5.10)
Lab: POSITIVE mg/dL — AB (ref 11.7–15.5)

## 2018-05-05 LAB — CHLAM/NG PCR URINE
Lab: NEGATIVE g/dL — ABNORMAL LOW (ref 6.0–8.0)
Lab: NEGATIVE mg/dL (ref 8.5–10.6)

## 2018-05-05 LAB — BLOOD GASES, PERIPHERAL VENOUS: Lab: 7.3 (ref 7.30–7.40)

## 2018-05-05 LAB — BETA HYDROXYBUTYRATE (KETONES): Lab: 0.1 MMOL/L — ABNORMAL LOW (ref <0.3)

## 2018-05-05 LAB — D-DIMER: Lab: 622 ng{FEU}/mL — ABNORMAL HIGH (ref <500)

## 2018-05-05 LAB — POC GLUCOSE
Lab: 375 mg/dL — ABNORMAL HIGH (ref 70–100)
Lab: 225 mg/dL — ABNORMAL HIGH (ref 70–100)

## 2018-05-05 MED ORDER — SULFAMETHOXAZOLE-TRIMETHOPRIM 800-160 MG PO TAB
1 | Freq: Once | ORAL | 0 refills | Status: CP
Start: 2018-05-05 — End: ?
  Administered 2018-05-05: 10:00:00 1 via ORAL

## 2018-05-05 MED ORDER — INSULIN GLARGINE 100 UNIT/ML (3 ML) SC INJ PEN
90 [IU] | Freq: Two times a day (BID) | SUBCUTANEOUS | 1 refills | 68.00000 days | Status: AC
Start: 2018-05-05 — End: ?

## 2018-05-05 MED ORDER — SODIUM CHLORIDE 0.9 % IJ SOLN
50 mL | Freq: Once | INTRAVENOUS | 0 refills | Status: CP
Start: 2018-05-05 — End: ?
  Administered 2018-05-05: 07:00:00 50 mL via INTRAVENOUS

## 2018-05-05 MED ORDER — INSULIN ASPART 100 UNIT/ML SC FLEXPEN
30 [IU] | Freq: Three times a day (TID) | SUBCUTANEOUS | 1 refills | 30.00000 days | Status: AC
Start: 2018-05-05 — End: ?

## 2018-05-05 MED ORDER — SODIUM CHLORIDE 0.9 % IV SOLP
1000 mL | INTRAVENOUS | 0 refills | Status: CP
Start: 2018-05-05 — End: ?
  Administered 2018-05-05: 08:00:00 1000 mL via INTRAVENOUS

## 2018-05-05 MED ORDER — INSULIN GLARGINE 100 UNIT/ML (3 ML) SC INJ PEN
90 [IU] | Freq: Once | SUBCUTANEOUS | 0 refills | Status: CP
Start: 2018-05-05 — End: ?
  Administered 2018-05-05: 13:00:00 90 [IU] via SUBCUTANEOUS

## 2018-05-05 MED ORDER — IOHEXOL 350 MG IODINE/ML IV SOLN
70 mL | Freq: Once | INTRAVENOUS | 0 refills | Status: CP
Start: 2018-05-05 — End: ?
  Administered 2018-05-05: 07:00:00 70 mL via INTRAVENOUS

## 2018-05-08 ENCOUNTER — Encounter: Admit: 2018-05-08 | Discharge: 2018-05-08 | Payer: MEDICAID

## 2018-05-08 DIAGNOSIS — E785 Hyperlipidemia, unspecified: ICD-10-CM

## 2018-05-08 DIAGNOSIS — E119 Type 2 diabetes mellitus without complications: Principal | ICD-10-CM

## 2018-05-08 DIAGNOSIS — I10 Essential (primary) hypertension: ICD-10-CM

## 2018-05-08 DIAGNOSIS — J449 Chronic obstructive pulmonary disease, unspecified: ICD-10-CM

## 2018-05-08 DIAGNOSIS — R109 Unspecified abdominal pain: ICD-10-CM

## 2018-05-08 DIAGNOSIS — F99 Mental disorder, not otherwise specified: ICD-10-CM

## 2018-05-09 ENCOUNTER — Emergency Department: Admit: 2018-05-09 | Discharge: 2018-05-09 | Disposition: A | Discharge status: Home / Self Care | Payer: MEDICAID

## 2018-05-09 DIAGNOSIS — Z794 Long term (current) use of insulin: ICD-10-CM

## 2018-05-09 DIAGNOSIS — E1165 Type 2 diabetes mellitus with hyperglycemia: Principal | ICD-10-CM

## 2018-05-09 LAB — URINALYSIS, MICROSCOPIC

## 2018-05-09 LAB — URINALYSIS DIPSTICK
Lab: 6 % — ABNORMAL HIGH (ref 5.0–8.0)
Lab: NEGATIVE % (ref 0–2)
Lab: NEGATIVE % (ref 24–44)
Lab: NEGATIVE % (ref 41–77)
Lab: NEGATIVE % (ref 4–12)
Lab: NEGATIVE K/UL (ref 1.0–4.8)
Lab: NEGATIVE K/UL (ref 1.8–7.0)

## 2018-05-09 LAB — CBC AND DIFF
Lab: 0.1 10*3/uL (ref 0–0.20)
Lab: 0.2 10*3/uL (ref 0–0.45)
Lab: 0.4 10*3/uL (ref 0–0.80)
Lab: 12 g/dL — ABNORMAL LOW (ref 13.5–16.5)
Lab: 38 % — ABNORMAL LOW (ref 40–50)
Lab: 8.3 K/UL — ABNORMAL LOW (ref 4.5–11.0)

## 2018-05-09 LAB — COMPREHENSIVE METABOLIC PANEL
Lab: 133 MMOL/L — ABNORMAL LOW (ref 137–147)
Lab: 3.7 MMOL/L (ref 3.5–5.1)

## 2018-05-09 LAB — MAGNESIUM: Lab: 1.9 mg/dL — ABNORMAL HIGH (ref 1.6–2.6)

## 2018-06-21 ENCOUNTER — Emergency Department: Admit: 2018-06-21 | Discharge: 2018-06-21 | Payer: MEDICAID

## 2018-06-21 ENCOUNTER — Emergency Department: Admit: 2018-06-21 | Discharge: 2018-06-22 | Disposition: A | Discharge status: Home / Self Care | Payer: MEDICAID

## 2018-06-21 LAB — POC GLUCOSE: Lab: 498 mg/dL — ABNORMAL HIGH (ref 70–100)

## 2018-06-21 MED ORDER — INSULIN REGULAR HUMAN(#) 1 UNIT/ML IJ SYRINGE
5 [IU] | Freq: Once | INTRAVENOUS | 0 refills | Status: CP
Start: 2018-06-21 — End: ?
  Administered 2018-06-22: 04:00:00 5 [IU] via INTRAVENOUS

## 2018-06-21 MED ORDER — ACETAMINOPHEN 325 MG PO TAB
650 mg | Freq: Once | ORAL | 0 refills | Status: CP
Start: 2018-06-21 — End: ?
  Administered 2018-06-22: 04:00:00 650 mg via ORAL

## 2018-06-21 MED ORDER — LACTATED RINGERS IV SOLP
1000 mL | Freq: Once | INTRAVENOUS | 0 refills | Status: CP
Start: 2018-06-21 — End: ?
  Administered 2018-06-22: 04:00:00 1000 mL via INTRAVENOUS

## 2018-06-22 ENCOUNTER — Emergency Department: Admit: 2018-06-21 | Discharge: 2018-06-21 | Payer: MEDICAID

## 2018-06-22 DIAGNOSIS — R000 Tachycardia, unspecified: ICD-10-CM

## 2018-06-22 DIAGNOSIS — Z794 Long term (current) use of insulin: ICD-10-CM

## 2018-06-22 DIAGNOSIS — R55 Syncope and collapse: Principal | ICD-10-CM

## 2018-06-22 DIAGNOSIS — G8929 Other chronic pain: ICD-10-CM

## 2018-06-22 DIAGNOSIS — Z9114 Patient's other noncompliance with medication regimen: ICD-10-CM

## 2018-06-22 DIAGNOSIS — Z590 Homelessness: ICD-10-CM

## 2018-06-22 DIAGNOSIS — Z89512 Acquired absence of left leg below knee: ICD-10-CM

## 2018-06-22 DIAGNOSIS — M25562 Pain in left knee: ICD-10-CM

## 2018-06-22 DIAGNOSIS — E1165 Type 2 diabetes mellitus with hyperglycemia: ICD-10-CM

## 2018-06-22 LAB — COMPREHENSIVE METABOLIC PANEL
Lab: 0.4 mg/dL (ref 0.3–1.2)
Lab: 0.8 mg/dL (ref 0.4–1.24)
Lab: 101 MMOL/L — ABNORMAL LOW (ref 98–110)
Lab: 116 U/L — ABNORMAL HIGH (ref 25–110)
Lab: 12 U/L (ref 7–40)
Lab: 134 MMOL/L — ABNORMAL LOW (ref 137–147)
Lab: 18 mg/dL (ref 7–25)
Lab: 3.7 MMOL/L — ABNORMAL LOW (ref 3.5–5.1)
Lab: 3.9 g/dL (ref 3.5–5.0)
Lab: 438 mg/dL — ABNORMAL HIGH (ref 70–100)
Lab: 7.8 g/dL (ref 6.0–8.0)
Lab: 8.9 mg/dL (ref 8.5–10.6)
Lab: 9 K/UL (ref 3–12)
Lab: >60 mL/min (ref >60)
Lab: >60 mL/min (ref >60)

## 2018-06-22 LAB — POC GLUCOSE
Lab: 387 mg/dL — ABNORMAL HIGH (ref 70–100)
Lab: 290 mg/dL — ABNORMAL HIGH (ref >60)
Lab: 347 mg/dL — ABNORMAL HIGH (ref 70–100)
Lab: 239 mg/dL — ABNORMAL HIGH (ref 70–100)

## 2018-06-22 LAB — CBC AND DIFF
Lab: 0 10*3/uL (ref 0–0.20)
Lab: 0.2 10*3/uL (ref 0–0.45)
Lab: 8.1 10*3/uL (ref 4.5–11.0)

## 2018-06-22 LAB — POC TROPONIN

## 2020-02-06 IMAGING — CT Spine^1_C_SPINE (Adult)
1 series · 12 of 14 positions shown, 15 images · non-contrast
Comparison: none

PROCEDURE: SPINE 1_C_SPINE (ADULT)
HISTORY: PT FELL IN HIS WHEELCHAIR WITH WHEELCHAIR LANDING ON HIM, LOC - PT C/O
BACK PAIN - AK
TECHNIQUE: Axial CT imaging of the cervical spine was performed without contrast. Two-dimensional
reconstructions were made to better evaluate pathology.

[Series 1: c-spine 1.5 soft tissue · axial · 0.28mm/px · z∈[+124,+264]mm · 12 of 112 slices shown, 15 images]
[im 9/112  soft-tissue]
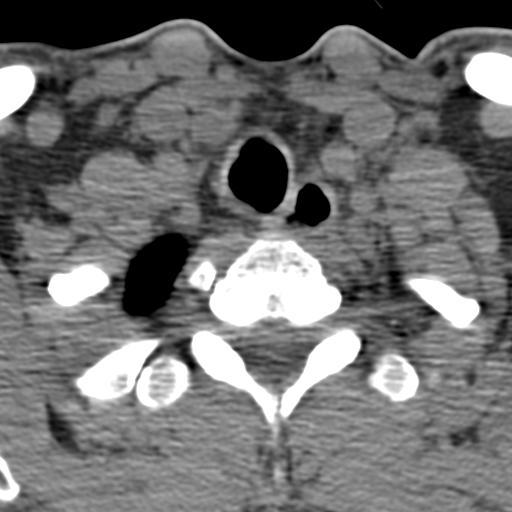
[im 9/112  bone]
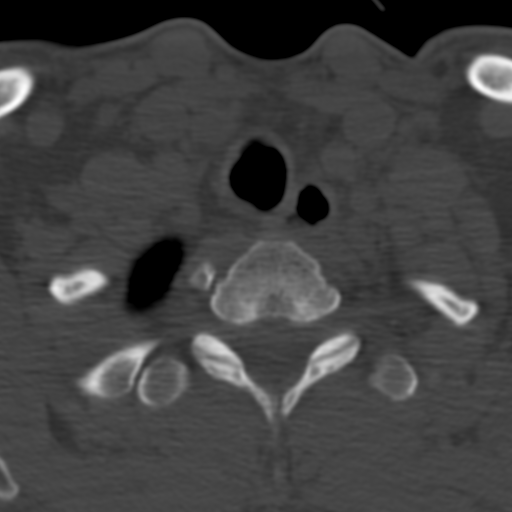
[im 18/112  bone]
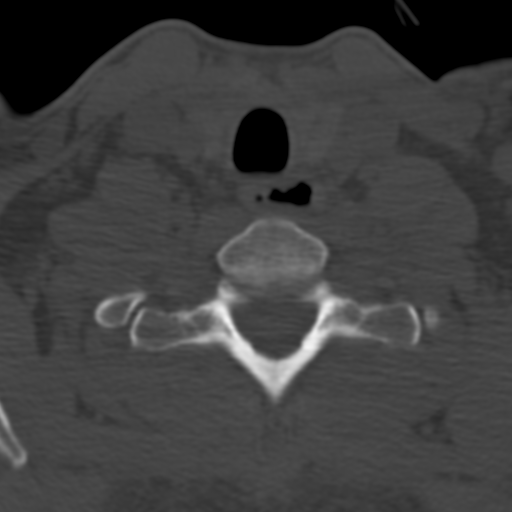
[im 26/112  bone]
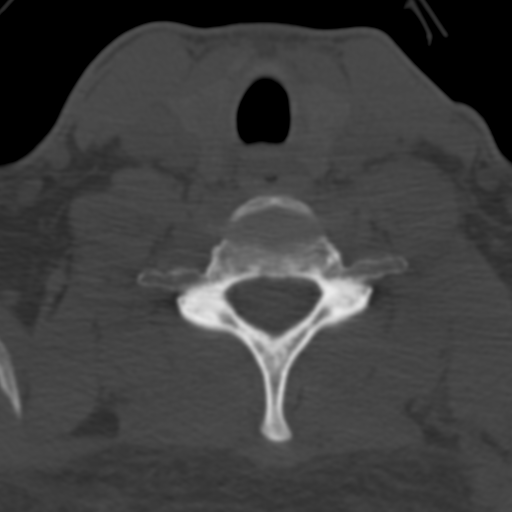
[im 35/112  bone]
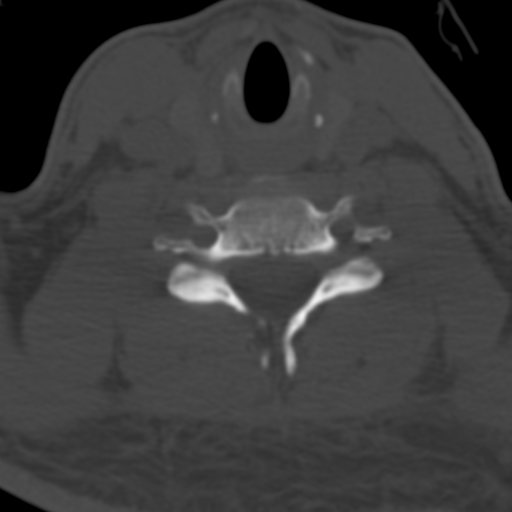
[im 43/112  soft-tissue]
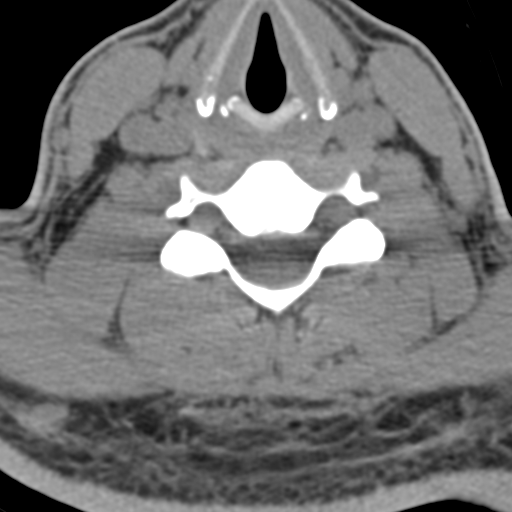
[im 43/112  bone]
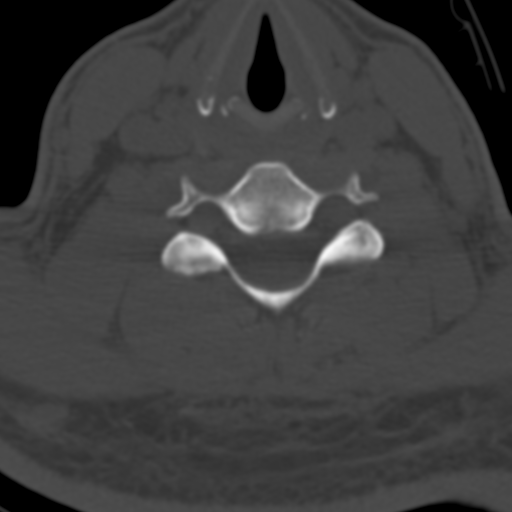
[im 52/112  bone]
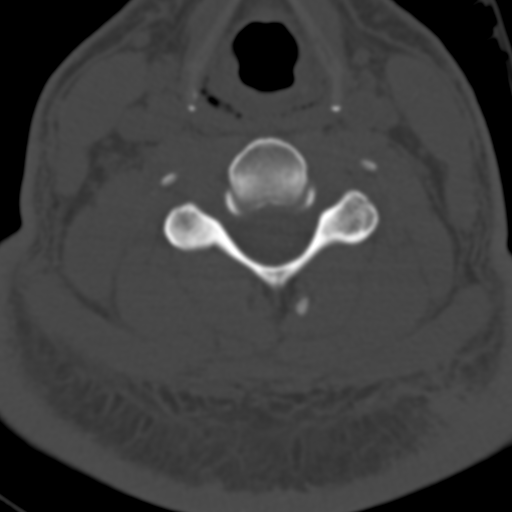
[im 60/112  bone]
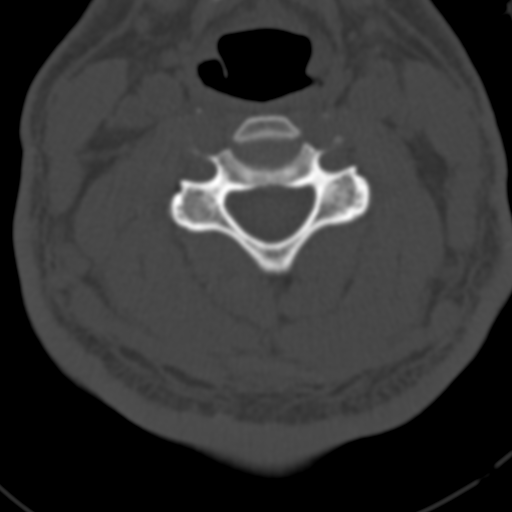
[im 69/112  bone]
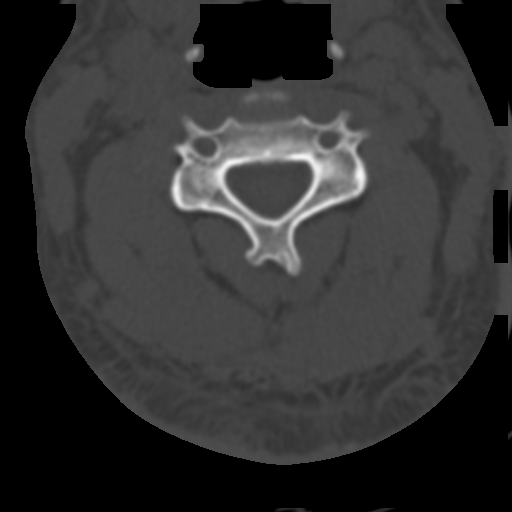
[im 77/112  soft-tissue]
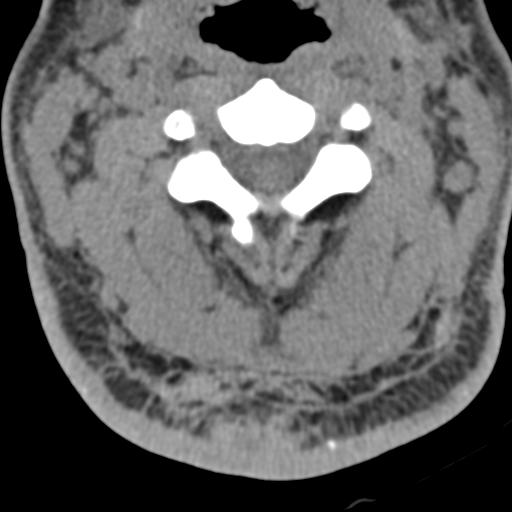
[im 77/112  bone]
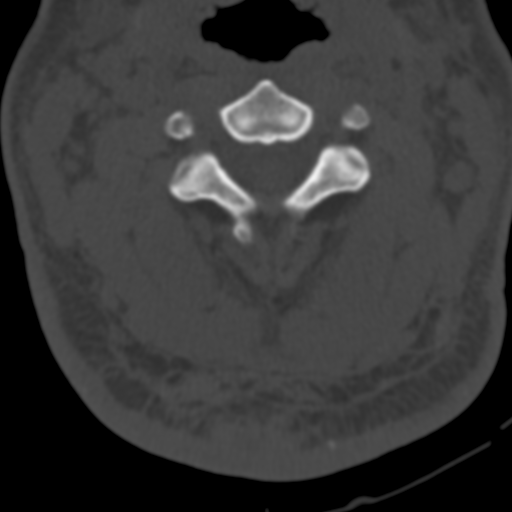
[im 86/112  bone]
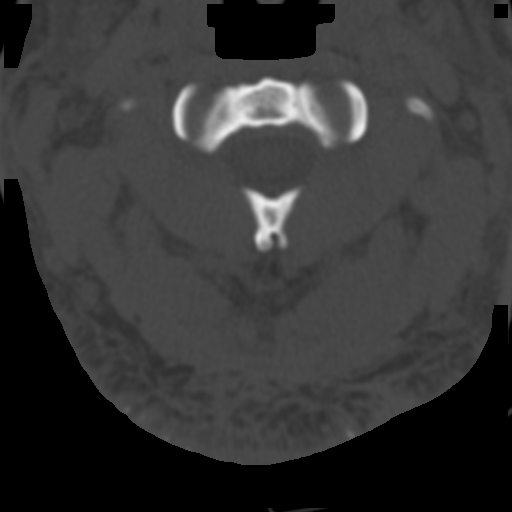
[im 94/112  bone]
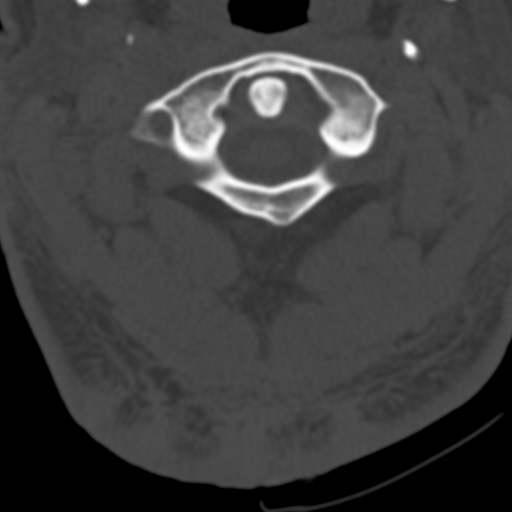
[im 103/112  bone]
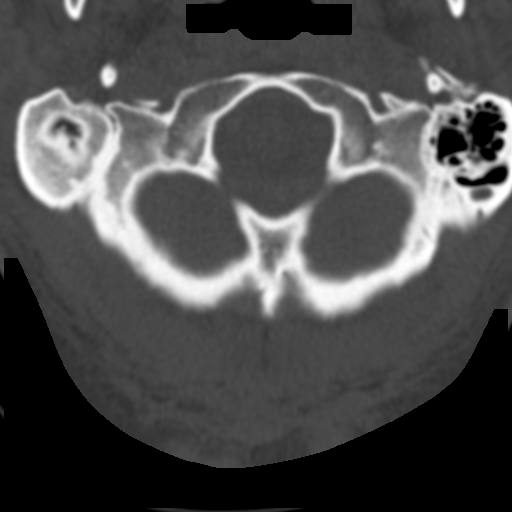

[12 of 14 positions shown; findings below may reference images not displayed]

FINDINGS: There is visualization from the occiput through T1. Anatomic vertebral alignment is seen.
Intervertebral disc space heights are preserved. There is no splaying of the spinous processes. The
prevertebral soft tissues and predental space are within normal limits. Normal osseous
craniocervical
junction is seen. No acute fracture or dislocation is seen. The visualized paraspinal soft tissue
structures are within normal limits. Lung apices are clear.
IMPRESSION: 1. No acute fracture or dislocation seen of the cervical spine.
2. Minimal degenerative spondylosis is seen in the mid cervical spine.

Tech Notes:

[REDACTED]
